# Patient Record
Sex: Male | Born: 1950
Health system: Southern US, Community
[De-identification: ages and names within clinical notes are randomized; demographics above are authoritative.]

## PROBLEM LIST (undated history)

## (undated) DIAGNOSIS — K5730 Diverticulosis of large intestine without perforation or abscess without bleeding: Secondary | ICD-10-CM

## (undated) DIAGNOSIS — Z86010 Personal history of colonic polyps: Secondary | ICD-10-CM

## (undated) DIAGNOSIS — Z860101 Personal history of adenomatous and serrated colon polyps: Secondary | ICD-10-CM

## (undated) DIAGNOSIS — I2510 Atherosclerotic heart disease of native coronary artery without angina pectoris: Secondary | ICD-10-CM

## (undated) DIAGNOSIS — E785 Hyperlipidemia, unspecified: Secondary | ICD-10-CM

## (undated) DIAGNOSIS — K219 Gastro-esophageal reflux disease without esophagitis: Secondary | ICD-10-CM

## (undated) DIAGNOSIS — D696 Thrombocytopenia, unspecified: Secondary | ICD-10-CM

## (undated) DIAGNOSIS — N400 Enlarged prostate without lower urinary tract symptoms: Secondary | ICD-10-CM

## (undated) DIAGNOSIS — K5790 Diverticulosis of intestine, part unspecified, without perforation or abscess without bleeding: Secondary | ICD-10-CM

## (undated) DIAGNOSIS — I440 Atrioventricular block, first degree: Secondary | ICD-10-CM

## (undated) DIAGNOSIS — Z8719 Personal history of other diseases of the digestive system: Secondary | ICD-10-CM

## (undated) DIAGNOSIS — G8929 Other chronic pain: Secondary | ICD-10-CM

## (undated) DIAGNOSIS — F419 Anxiety disorder, unspecified: Secondary | ICD-10-CM

## (undated) DIAGNOSIS — H409 Unspecified glaucoma: Secondary | ICD-10-CM

## (undated) DIAGNOSIS — H8110 Benign paroxysmal vertigo, unspecified ear: Secondary | ICD-10-CM

## (undated) DIAGNOSIS — K269 Duodenal ulcer, unspecified as acute or chronic, without hemorrhage or perforation: Secondary | ICD-10-CM

## (undated) DIAGNOSIS — R001 Bradycardia, unspecified: Secondary | ICD-10-CM

## (undated) DIAGNOSIS — E7800 Pure hypercholesterolemia, unspecified: Secondary | ICD-10-CM

## (undated) DIAGNOSIS — M1990 Unspecified osteoarthritis, unspecified site: Secondary | ICD-10-CM

## (undated) HISTORY — DX: Duodenal ulcer, unspecified as acute or chronic, without hemorrhage or perforation: K26.9

## (undated) HISTORY — DX: Benign paroxysmal vertigo, unspecified ear: H81.10

## (undated) HISTORY — DX: Atrioventricular block, first degree: I44.0

## (undated) HISTORY — DX: Thrombocytopenia, unspecified: D69.6

## (undated) HISTORY — DX: Bradycardia, unspecified: R00.1

## (undated) HISTORY — DX: Hyperlipidemia, unspecified: E78.5

## (undated) HISTORY — DX: Diverticulosis of large intestine without perforation or abscess without bleeding: K57.30

## (undated) HISTORY — PX: BACK SURGERY: SHX140

## (undated) HISTORY — DX: Personal history of other diseases of the digestive system: Z87.19

## (undated) HISTORY — DX: Atherosclerotic heart disease of native coronary artery without angina pectoris: I25.10

## (undated) HISTORY — DX: Pure hypercholesterolemia, unspecified: E78.00

## (undated) HISTORY — DX: Personal history of adenomatous and serrated colon polyps: Z86.0101

## (undated) HISTORY — DX: Other chronic pain: G89.29

## (undated) HISTORY — DX: Gastro-esophageal reflux disease without esophagitis: K21.9

## (undated) HISTORY — DX: Personal history of colonic polyps: Z86.010

## (undated) HISTORY — DX: Unspecified glaucoma: H40.9

## (undated) HISTORY — DX: Benign prostatic hyperplasia without lower urinary tract symptoms: N40.0

## (undated) HISTORY — PX: APPENDECTOMY: SHX54

## (undated) HISTORY — DX: Anxiety disorder, unspecified: F41.9

## (undated) HISTORY — DX: Diverticulosis of intestine, part unspecified, without perforation or abscess without bleeding: K57.90

## (undated) HISTORY — DX: Unspecified osteoarthritis, unspecified site: M19.90

---

## 1995-05-05 HISTORY — PX: OTHER SURGICAL HISTORY: SHX169

## 2001-09-08 ENCOUNTER — Other Ambulatory Visit: Admission: RE | Admit: 2001-09-08 | Discharge: 2001-09-08 | Payer: Self-pay | Admitting: *Deleted

## 2007-10-07 ENCOUNTER — Emergency Department (HOSPITAL_COMMUNITY): Admission: EM | Admit: 2007-10-07 | Discharge: 2007-10-07 | Payer: Self-pay | Admitting: Family Medicine

## 2008-05-04 HISTORY — PX: APPENDECTOMY: SHX54

## 2008-08-20 ENCOUNTER — Encounter (INDEPENDENT_AMBULATORY_CARE_PROVIDER_SITE_OTHER): Payer: Self-pay | Admitting: Surgery

## 2008-08-20 ENCOUNTER — Observation Stay (HOSPITAL_COMMUNITY): Admission: EM | Admit: 2008-08-20 | Discharge: 2008-08-21 | Payer: Self-pay | Admitting: Emergency Medicine

## 2009-07-21 ENCOUNTER — Emergency Department (HOSPITAL_COMMUNITY): Admission: EM | Admit: 2009-07-21 | Discharge: 2009-07-21 | Payer: Self-pay | Admitting: Emergency Medicine

## 2010-08-13 LAB — DIFFERENTIAL
Basophils Relative: 0 % (ref 0–1)
Eosinophils Absolute: 0 10*3/uL (ref 0.0–0.7)
Eosinophils Relative: 0 % (ref 0–5)
Lymphs Abs: 1 10*3/uL (ref 0.7–4.0)
Neutro Abs: 8.4 10*3/uL — ABNORMAL HIGH (ref 1.7–7.7)

## 2010-08-13 LAB — BASIC METABOLIC PANEL
BUN: 15 mg/dL (ref 6–23)
CO2: 24 mEq/L (ref 19–32)
Creatinine, Ser: 0.92 mg/dL (ref 0.4–1.5)
GFR calc Af Amer: >60 mL/min (ref >60)
Glucose, Bld: 135 mg/dL — ABNORMAL HIGH (ref 70–99)
Potassium: 4 mEq/L (ref 3.5–5.1)
Sodium: 137 mEq/L (ref 135–145)

## 2010-08-13 LAB — URINALYSIS, ROUTINE W REFLEX MICROSCOPIC
Bilirubin Urine: NEGATIVE
Leukocytes, UA: NEGATIVE
Nitrite: NEGATIVE
Protein, ur: 30 mg/dL — AB
Specific Gravity, Urine: 1.035 — ABNORMAL HIGH (ref 1.005–1.030)
Urobilinogen, UA: 0.2 mg/dL (ref 0.0–1.0)

## 2010-08-13 LAB — CBC
HCT: 42.2 % (ref 39.0–52.0)
MCHC: 34.2 g/dL (ref 30.0–36.0)
MCV: 90.4 fL (ref 78.0–100.0)
RDW: 13.1 % (ref 11.5–15.5)
WBC: 9.8 10*3/uL (ref 4.0–10.5)

## 2010-08-13 LAB — POCT CARDIAC MARKERS
CKMB, poc: 1.2 ng/mL (ref 1.0–8.0)
Myoglobin, poc: 58.7 ng/mL (ref 12–200)
Troponin i, poc: <0.05 ng/mL (ref 0.00–0.09)

## 2010-08-13 LAB — URINE MICROSCOPIC-ADD ON

## 2010-08-13 LAB — HEPATIC FUNCTION PANEL
AST: 24 U/L (ref 0–37)
Indirect Bilirubin: 0.4 mg/dL (ref 0.3–0.9)
Total Bilirubin: 0.6 mg/dL (ref 0.3–1.2)

## 2010-08-13 LAB — LIPASE, BLOOD: Lipase: 27 U/L (ref 11–59)

## 2010-09-16 NOTE — Op Note (Signed)
[name], Berger NO.:  [id number]   MEDICAL RECORD NO.:  [id number]          PATIENT TYPE:  INP   LOCATION:  1825                         FACILITY:  [facility]   PHYSICIAN:  Ardeth Sportsman, MD     DATE OF BIRTH:  [birth year]   DATE OF PROCEDURE:  08/20/2008  DATE OF DISCHARGE:                               OPERATIVE REPORT   PRIMARY CARE SERVICE:  Georgann Housekeeper, MD.   SURGEON:  Ardeth Sportsman, MD.   ASSISTANT:  None.   PREOPERATIVE DIAGNOSIS:  Acute appendicitis.   POSTOPERATIVE DIAGNOSIS:  Acute suppurative appendicitis.   PROCEDURE PERFORMED:  Laparoscopic appendectomy.   ANESTHESIA:  1. General anesthesia.  2. Local anesthetic and a field block around the port site.   SPECIMENS:  Appendix.   DRAINS:  None.   ESTIMATED BLOOD LOSS:  40 mL.   COMPLICATIONS:  None apparent.   INDICATIONS:  Dustin Berger is a pleasant 60 year old gentleman who had  abdominal pain for the past 24 hours.  It intensified and became more in  the lower abdomen.  He came to the emergency room, and workup by a CAT  scan and physical exam was concerning for acute appendicitis.   Physiology of the digestive tract was explained.  Pathophysiology of  appendicitis was discussed.  After discussion, recommendation was made  for laparoscopic appendectomy.  Possibly, conversion to open discussed.  Risks, benefits, and alternatives were discussed.  Questions were  answered and agreed to proceed.   OPERATIVE FINDINGS:  He had a retrocecal appendix with the tip of the  appendix coming towards the retroperitoneal midline.  There was some  suppuration, but no frank perforation or abscess.  The rest of his  abdomen was unremarkable for any concerns.   DESCRIPTION OF PROCEDURE:  Informed consent was confirmed.  The patient  received IV Unasyn prior to surgery.  He had sequential compression  devices active during the entire case.  He underwent general anesthesia  without any  difficulty.  He had a Foley catheter sterilely placed.  He  was positioned supine, both arms tucked.  His abdomen and perineum was  cleaned, prepped, and draped in a sterile fashion.   Using optical entry technique, a 5-mm port was placed in the left upper  quadrant, and the patient in deep reversed in Trendelenburg and left  side up.  Camera inspection revealed no intraabdominal injury.  Under  direct visualization, a 5-mm port was placed through the umbilicus and a  12-mm port was placed suprapubically aimed away from the bladder.   Camera inspection revealed some adhesions in the right lower quadrant.  The patient was positioned head down and left side down and small bowel  was reflected out of the right pelvis, and sharply releasing the cecum  in ascending colon in a lateral to medial fashion to help roll the cecum  off the pelvic side walls and off its retroperitoneal attachments.  The  attachments of the ileal mesentery to retroperitoneum were carefully  isolated and removed sharply as well.  In rolling this way, I finally  found a retrocecal appendix.  The base being cleaned, but the tip was  rather inflamed and explained some adhesions as well.  Window was made  at the base of the appendix and appendix was stapled off taking a  healthy cuff of cecum using laparoscopic stapler.   Careful cauterized dissection was done on the appendiceal mesentery  until it came down to a point where obviously the appendiceal artery  was.  This was ligated using a 0 PDS Endoloop and then cautery was used  to help free the appendix from its remaining attachments.  It was placed  inside EndoCatch bag and removed out from the suprapubic port.   Copious irrigation was done with good hemostasis at the end of the case.  There had been some moderate inflation and oozing, but all was quite at  the end.  Staple line was healthy and intact.  A copious irrigation was  done of the pelvis along the right  gutter as well as up in the liver,  was clear return at the end.  Capnoperitoneum was evacuated and ports  were removed.  The suprapubic fascia, the 12-mm port sight was closed  using a 0 Vicryl stitch and a UR 6 since it was superficial and easy to  get to.  The skin was closed using 4-0 Monocryl stitch.  Sterile  dressing was applied.   The patient was extubated and sent to recovery room in stable condition.  I am about to explain the operative findings to patient's family.      Ardeth Sportsman, MD  Electronically Signed     SCG/MEDQ  D:  08/20/2008  T:  08/21/2008  Job:  [id number]   cc:   Georgann Housekeeper, MD

## 2010-09-16 NOTE — Consult Note (Signed)
Dustin Berger, LAZAR            ACCOUNT NO.:  [id number]   MEDICAL RECORD NO.:  [id number]          PATIENT TYPE:  EMS   LOCATION:  MAJO                         FACILITY:  [facility]   PHYSICIAN:  Clovis Pu. Cornett, M.D.DATE OF BIRTH:  10/27/1950   DATE OF CONSULTATION:  08/20/2008  DATE OF DISCHARGE:                                 CONSULTATION   PHYSICIAN REQUESTING CONSULTATION:  Donnetta Hutching, MD   REASON FOR CONSULTATION:  Abdominal pain.   HISTORY OF PRESENT ILLNESS:  The patient is a 60 year old male with 1-  day history of abdominal pain.  The pain is located in his lower  abdomen.  It is 5-8/10.  There is no radiation.  They are sharp in  character, constant, and getting worse over the last 24 hours.  There is  no associated nausea, vomiting, fever, chills, or diarrhea.  The pain  does not radiate and is made worse with movement.  I was asked to see  the patient at the request of Dr. Adriana Simas in consultation.   PAST MEDICAL HISTORY:  Peptic ulcer disease.   PAST SURGICAL HISTORY:  Back surgery.   SOCIAL HISTORY:  Denies tobacco use, does drink alcohol occasionally.  He is married.   ALLERGIES:  None.   FAMILY HISTORY:  Noncontributory.   REVIEW OF SYSTEMS:  A 12-point review of systems as stated above.   PHYSICAL EXAMINATION:  VITAL SIGNS:  Temperature 97, pulse 53, and blood  pressure 106/61.  HEENT:  Extraocular movements are intact.  Oropharynx clear.  NECK:  Supple and nontender.  Trachea midline.  PULMONARY:  Lungs are clear bilaterally.  Chest wall motion is normal.  CARDIOVASCULAR:  Regular rate and rhythm with 2-3/6 systolic ejection  murmur noted.  ABDOMEN:  Tender in the lower midline just below the umbilicus.  There  is rebound.  No diffuse peritonitis.  No mass or hernia.  EXTREMITIES:  Joints appear normal with no deformity.  Muscle tone is  normal.  NEUROLOGIC:  Glasgow coma scale is 15.  Motor and sensory functions are  grossly intact.   DIAGNOSTIC  STUDIES:  Abdominal and pelvic CT scan shows early  appendicitis.  There is no evidence of abscess or free fluid.  His white  count 9800, hemoglobin 14, and platelet count is 156,000.  Sodium 137,  potassium 4, chloride 109, CO2 24, BUN 15, creatinine 0.9, glucose 135.  LFTs are normal.  Lipase is normal.  Chest x-ray shows no acute disease.  He does have some chronic right lower lobe bronchitic changes.   IMPRESSION:  Acute appendicitis.   PLAN:  Recommend laparoscopic appendectomy.  I have discussed the case  with Dr. Michaell Cowing, he was now called today.  Risk of bleeding, infection,  organ injury, need for open procedure, all potential complications.  He  will be admitted and placed on Unasyn 3 g IV q.6 and IV fluids and then  laparoscopic appendectomy will be performed at this point.      Thomas A. Cornett, M.D.  Electronically Signed     TAC/MEDQ  D:  08/20/2008  T:  08/20/2008  Job:  [id number]   cc:   Donnetta Hutching, MD

## 2011-08-13 ENCOUNTER — Other Ambulatory Visit: Payer: Self-pay | Admitting: Surgery

## 2011-12-03 ENCOUNTER — Other Ambulatory Visit: Payer: Self-pay | Admitting: Orthopedic Surgery

## 2011-12-10 NOTE — H&P (Signed)
Dustin Berger, Sr. Presents  for a consult regarding his recurrent large mucoid cyst on the dorsoradial aspect of his right thumb IP joint.  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  He has had a mucoid cyst in the dorsal aspect of his thumb for five months.  He was seen for evaluation at his primary care office and referred for dermatology consult. He had his  cyst debrided by Dr. Terri Piedra.  He has had a rather rapid recurrence.  He is now using superglue type material to try to protect the cyst.    He now presents for an upper extremity orthopaedic consult.    His past history reveals that he has no drug allergies.  He is on no routine medications.  Social history reveals that he is married, he is a nonsmoker.  He does not use alcoholic beverages.  Family history detailed and positive for diabetes.   14-point review of systems reveals corrective lenses, hearing impairment, history of peptic ulcer disease.   Physical examination reveals a well appearing 61 year-old gentleman . Inspection of his hands reveals stigmata of osteoarthritis including Heberden's and Bouchard's nodes.  He has a very substantial mucoid cyst in the dorsoradial aspect of his nail fold. There is some nail pressure change in the nail plate.  He appears to have an impending rupture.  He has a large cyst tracking proximal to the IP joint. He has full range of motion of his CMC and MP joints.  We did not test his  IP range of motion due to concern about cyst rupture.    Plain films of his thumb, four views, were obtained.  He has multiple loose bodies within the interphalangeal joint as well as large marginal osteophytes.   ASSESSMENT:  Significant osteoarthritis of right thumb IP joint with mucoid cyst formation, loose bodies and marginal osteophytes.   PLAN:  I explained to him the art of debriding the IP joint in an effort to resolve his predicament.    I would recommend repeat cyst excision, I&D of the right thumb IP  joint, removal of marginal osteophytes and the loose bodies.  There is a palmar loose body that is probably not accessible without major surgery.   There is a small chance that he will have recurrence of his cyst.  Typically with good joint debridement we can prevent recurrence of the cyst.     H&P documentation: 12/11/2011  -History and Physical Reviewed  -Patient has been re-examined  -No change in the plan of care  Wyn Forster, MD

## 2011-12-11 ENCOUNTER — Encounter (HOSPITAL_BASED_OUTPATIENT_CLINIC_OR_DEPARTMENT_OTHER)
Admission: RE | Disposition: A | Discharge status: Home / Self Care | Payer: Self-pay | Source: Ambulatory Visit | Attending: Orthopedic Surgery

## 2011-12-11 ENCOUNTER — Ambulatory Visit (HOSPITAL_BASED_OUTPATIENT_CLINIC_OR_DEPARTMENT_OTHER)
Admission: RE | Admit: 2011-12-11 | Discharge: 2011-12-11 | Disposition: A | Discharge status: Home / Self Care | Payer: BC Managed Care – PPO | Source: Ambulatory Visit | Attending: Orthopedic Surgery | Admitting: Orthopedic Surgery

## 2011-12-11 ENCOUNTER — Encounter (HOSPITAL_BASED_OUTPATIENT_CLINIC_OR_DEPARTMENT_OTHER): Payer: Self-pay | Admitting: *Deleted

## 2011-12-11 DIAGNOSIS — M6740 Ganglion, unspecified site: Secondary | ICD-10-CM | POA: Insufficient documentation

## 2011-12-11 DIAGNOSIS — M19049 Primary osteoarthritis, unspecified hand: Secondary | ICD-10-CM | POA: Insufficient documentation

## 2011-12-11 DIAGNOSIS — M24049 Loose body in unspecified finger joint(s): Secondary | ICD-10-CM | POA: Insufficient documentation

## 2011-12-11 HISTORY — PX: MASS EXCISION: SHX2000

## 2011-12-11 SURGERY — MINOR EXCISION OF MASS
Anesthesia: LOCAL | Site: Thumb | Laterality: Right | Wound class: Clean

## 2011-12-11 MED ORDER — CEPHALEXIN 500 MG PO CAPS: 500.0000 mg | ORAL_CAPSULE | Freq: Three times a day (TID) | ORAL | Status: AC

## 2011-12-11 MED ORDER — CHLORHEXIDINE GLUCONATE 4 % EX LIQD: 60.0000 mL | Freq: Once | CUTANEOUS | Status: DC

## 2011-12-11 MED ORDER — LIDOCAINE HCL 2 % IJ SOLN
INTRAMUSCULAR | Status: DC | PRN
  Administered 2011-12-11: 4 mL

## 2011-12-11 MED ORDER — HYDROCODONE-ACETAMINOPHEN 5-325 MG PO TABS: ORAL_TABLET | ORAL | Status: AC

## 2011-12-11 SURGICAL SUPPLY — 40 items
BANDAGE ADHESIVE 1X3 (GAUZE/BANDAGES/DRESSINGS) IMPLANT
BLADE SURG 15 STRL LF DISP TIS (BLADE) ×1 IMPLANT
BLADE SURG 15 STRL SS (BLADE) ×2
BNDG CMPR 9X4 STRL LF SNTH (GAUZE/BANDAGES/DRESSINGS) ×1
BNDG CMPR MD 5X2 ELC HKLP STRL (GAUZE/BANDAGES/DRESSINGS)
BNDG COHESIVE 1X5 TAN STRL LF (GAUZE/BANDAGES/DRESSINGS) ×1 IMPLANT
BNDG ELASTIC 2 VLCR STRL LF (GAUZE/BANDAGES/DRESSINGS) IMPLANT
BNDG ESMARK 4X9 LF (GAUZE/BANDAGES/DRESSINGS) ×1 IMPLANT
BRUSH SCRUB EZ PLAIN DRY (MISCELLANEOUS) ×2 IMPLANT
CLOTH BEACON ORANGE TIMEOUT ST (SAFETY) ×2 IMPLANT
CORDS BIPOLAR (ELECTRODE) IMPLANT
COVER MAYO STAND STRL (DRAPES) ×3 IMPLANT
CUFF TOURNIQUET SINGLE 18IN (TOURNIQUET CUFF) ×1 IMPLANT
DECANTER SPIKE VIAL GLASS SM (MISCELLANEOUS) IMPLANT
DRAIN PENROSE 1/2X12 LTX STRL (WOUND CARE) IMPLANT
DRAPE EXTREMITY T 121X128X90 (DRAPE) ×1 IMPLANT
DRAPE SURG 17X23 STRL (DRAPES) ×2 IMPLANT
GAUZE SPONGE 4X4 12PLY STRL LF (GAUZE/BANDAGES/DRESSINGS) ×4 IMPLANT
GAUZE XEROFORM 1X8 LF (GAUZE/BANDAGES/DRESSINGS) ×1 IMPLANT
GLOVE BIO SURGEON STRL SZ7 (GLOVE) ×1 IMPLANT
GLOVE BIOGEL M STRL SZ7.5 (GLOVE) ×2 IMPLANT
GLOVE BIOGEL PI IND STRL 7.0 (GLOVE) IMPLANT
GLOVE BIOGEL PI INDICATOR 7.0 (GLOVE) ×2
GLOVE ORTHO TXT STRL SZ7.5 (GLOVE) ×2 IMPLANT
GOWN PREVENTION PLUS XLARGE (GOWN DISPOSABLE) ×2 IMPLANT
NEEDLE 27GAX1X1/2 (NEEDLE) ×1 IMPLANT
PACK BASIN DAY SURGERY FS (CUSTOM PROCEDURE TRAY) ×2 IMPLANT
PADDING CAST ABS 4INX4YD NS (CAST SUPPLIES) ×1
PADDING CAST ABS COTTON 4X4 ST (CAST SUPPLIES) ×1 IMPLANT
SLING ARM FOAM STRAP LRG (SOFTGOODS) ×1 IMPLANT
SPONGE GAUZE 4X4 12PLY (GAUZE/BANDAGES/DRESSINGS) ×2 IMPLANT
STOCKINETTE 4X48 STRL (DRAPES) ×2 IMPLANT
SUT ETHILON 5 0 P 3 18 (SUTURE) ×1
SUT NYLON ETHILON 5-0 P-3 1X18 (SUTURE) ×1 IMPLANT
SYR 3ML 23GX1 SAFETY (SYRINGE) IMPLANT
SYR CONTROL 10ML LL (SYRINGE) ×1 IMPLANT
TOWEL OR 17X24 6PK STRL BLUE (TOWEL DISPOSABLE) ×3 IMPLANT
TRAY DSU PREP LF (CUSTOM PROCEDURE TRAY) ×2 IMPLANT
UNDERPAD 30X30 INCONTINENT (UNDERPADS AND DIAPERS) ×2 IMPLANT
WATER STERILE IRR 1000ML POUR (IV SOLUTION) ×1 IMPLANT

## 2011-12-11 NOTE — Brief Op Note (Signed)
12/11/2011  11:34 AM  PATIENT:  Dustin Berger  61 y.o. male  PRE-OPERATIVE DIAGNOSIS:  MUCOID CYST RIGHT THUMB with large IP joint loose body  POST-OPERATIVE DIAGNOSIS:  Mucoid cyst right thumb with large loose body and synovitis  PROCEDURE:  Interphalangeal joint debridement with removal of large loose body from radial base of distal phalanx  SURGEON:  Surgeon(s) and Role:    * Wyn Forster., MD - Primary  PHYSICIAN ASSISTANT:   ASSISTANTS: Mallory Shirk.A-C   ANESTHESIA:   local  EBL:     BLOOD ADMINISTERED:none  DRAINS: none   LOCAL MEDICATIONS USED:  XYLOCAINE   SPECIMEN:  Excision  DISPOSITION OF SPECIMEN:  N/A  COUNTS:  YES  TOURNIQUET:   Total Tourniquet Time Documented: Forearm (Right) - 18 minutes  DICTATION: .Other Dictation: Dictation Number [id number]  PLAN OF CARE: Discharge to home after PACU  PATIENT DISPOSITION:  PACU - hemodynamically stable.

## 2011-12-11 NOTE — Op Note (Signed)
[id number] 

## 2011-12-11 NOTE — OR Nursing (Signed)
No specimen sent to Pathology per request of Dr. Teressa Senter.

## 2011-12-14 ENCOUNTER — Encounter (HOSPITAL_BASED_OUTPATIENT_CLINIC_OR_DEPARTMENT_OTHER): Payer: Self-pay | Admitting: Orthopedic Surgery

## 2011-12-14 NOTE — Op Note (Signed)
NAMEDONIVIN, WIRT            ACCOUNT NO.:  [id number]  MEDICAL RECORD NO.:  [id number]  LOCATION:                                 FACILITY:  PHYSICIAN:  Katy Fitch. Farran Amsden, M.D.      DATE OF BIRTH:  DATE OF PROCEDURE:  12/11/2011 DATE OF DISCHARGE:                              OPERATIVE REPORT   PREOPERATIVE DIAGNOSES:  Right thumb large mucoid cyst, recurrent, following prior debridement by dermatologist with large loose body within dorsoradial aspect of right thumb interphalangeal joint with background significant degenerative arthritis of interphalangeal joint.  POSTOPERATIVE DIAGNOSES:  Right thumb large mucoid cyst, recurrent, following prior debridement by dermatologist with large loose body within dorsoradial aspect of right thumb interphalangeal joint with background significant degenerative arthritis of interphalangeal joint.  OPERATIONS: 1. Arthrotomy of right thumb interphalangeal joint with removal of     large dorsal radial loose body with debridement of marginal     osteophytes, synovectomy, and irrigation of interphalangeal joint. 2. Resection of persistent/recurrent mucoid cyst on dorsal radial     aspect of right thumb nail fold.  OPERATING SURGEON:  Katy Fitch. Landrie Beale, MD.  ASSISTANT:  Marveen Reeks. Dasnoit, PA-C.  ANESTHESIA:  Lidocaine 2% metacarpal head-level block of right thumb. This was performed as a minor operating procedure.  ANESTHETIST:  Octaviano Glow. Pamalee Leyden, M.D.  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  He has had a history of osteoarthritis of the thumb interphalangeal joints for many years.  He has large osteophytes at the base of his distal phalanges bilaterally.  On the right, he developed a large mucoid cyst.  This was debrided by dermatologist.  Unfortunately, he had recurrence.  He was then sent for an upper extremity orthopedic consult.  His past history is reviewed in detail.  He does  have background osteoarthritis affecting multiple joints.  X-rays of his thumb documented a large loose body in the dorsoradial aspect of his thumb IP joint and a small loose body in the palmar aspect of the joint.  We advised Mr. Doiron that there was a good chance we could relieve his mucoid cyst predicament by debridement of his IP joint with removal of loose body.  We will not try to remove the palmar loose body as it is in a very inaccessible position.  Detailed informed consent regarding the anticipated surgery is provided in the office and in the Holding Area of the [facility]. Questions were invited and answered in detail.  PROCEDURE:  Blayde Bacigalupi was brought to room 1 of the [facility] and placed in supine position on the operating table.  Following informed consent and alcohol Betadine prep, 4 mL of 2% lidocaine without epinephrine were infiltrated at metacarpal head level circumferentially to obtain a digital block.  After 10 minutes, excellent anesthesia was achieved.  The right hand and arm were then prepped with Betadine soap and solution, sterilely draped.  A pneumatic tourniquet was applied to the proximal forearm.  A digital tourniquet was not feasible due to the location of the cyst and loose body.  After routine surgical time-out, the thumb was exsanguinated as well as the arm with  an Esmarch bandage, followed by inflation of the arterial tourniquet on the proximal forearm to 220 mmHg.  Procedure commenced with a longitudinal incision incorporating the cyst and the large osteophyte/loose body.  Subcutaneous tissues were carefully divided taking care to identify and spare the dorsal radial sensory branch to the dorsal radial aspect of the thumb.  Arthrotomy was created by excision of the capsule between the terminal extensor tendon slip and the radial collateral ligament.  A very large loose body measuring about 8 x 4 x 5 mm was  carefully removed with a 2-mm osteotome and a fine Freer.  This was shown to Mr. Harshman in the operating room.  We then performed a meticulous synovectomy and debridement of the IP joint.  There were large marginal osteophytes and multiple cartilaginous loose bodies.  These were debrided thoroughly. The IP joint was then irrigated under pressure with a 19-gauge dental needle.  The dorsal nail fold was then carefully dissected.  Care was taken to avoid the dorsal and intermediate nail fold.  The cyst appeared to be tracking to the dorsoradial and central aspect of the IP joint.  A micro- curette was used directly on the periosteum of the distal phalanx to remove the membrane of the cyst.  The contents of this were expressed. The wound was then closed with a small rotation flap utilizing 5-0 nylon sutures.  There were no apparent complications.  The tourniquet was released with immediate cap refill of the fingers.  There was some vasospasm in the thumb, but excellent cap refill and normal turgor was achieved.  The wound was dressed with Xeroflo sterile gauze and a wrist-base Coban thumb dressing.  There were no apparent complications.  Mr. Litzau is provided a prescription for hydrocodone 5/325, 1 p.o. q.4-6 hours p.r.n. pain, 20 tablets, without refill; also Keflex 5 mg 1 p.o. q.8 hours for 4 days as a prophylactic antibiotic.     Katy Fitch Isra Lindy, M.D.     RVS/MEDQ  D:  12/11/2011  T:  12/12/2011  Job:  [id number]

## 2012-07-30 ENCOUNTER — Emergency Department (HOSPITAL_COMMUNITY): Payer: BC Managed Care – PPO

## 2012-07-30 ENCOUNTER — Emergency Department (HOSPITAL_COMMUNITY)
Admission: EM | Admit: 2012-07-30 | Discharge: 2012-07-30 | Disposition: A | Discharge status: Home / Self Care | Payer: BC Managed Care – PPO | Attending: Emergency Medicine | Admitting: Emergency Medicine

## 2012-07-30 ENCOUNTER — Encounter (HOSPITAL_COMMUNITY): Payer: Self-pay | Admitting: Physical Medicine and Rehabilitation

## 2012-07-30 DIAGNOSIS — R079 Chest pain, unspecified: Secondary | ICD-10-CM | POA: Insufficient documentation

## 2012-07-30 DIAGNOSIS — R42 Dizziness and giddiness: Secondary | ICD-10-CM | POA: Insufficient documentation

## 2012-07-30 DIAGNOSIS — R011 Cardiac murmur, unspecified: Secondary | ICD-10-CM | POA: Insufficient documentation

## 2012-07-30 LAB — BASIC METABOLIC PANEL
BUN: 18 mg/dL (ref 6–23)
GFR calc Af Amer: 73 mL/min — ABNORMAL LOW (ref >90)
GFR calc non Af Amer: 63 mL/min — ABNORMAL LOW (ref >90)
Potassium: 3.8 mEq/L (ref 3.5–5.1)
Sodium: 140 mEq/L (ref 135–145)

## 2012-07-30 LAB — POCT I-STAT TROPONIN I

## 2012-07-30 LAB — GLUCOSE, CAPILLARY: Glucose-Capillary: 106 mg/dL — ABNORMAL HIGH (ref 70–99)

## 2012-07-30 LAB — CBC
MCHC: 34.2 g/dL (ref 30.0–36.0)
RDW: 13 % (ref 11.5–15.5)

## 2012-07-30 MED ORDER — ASPIRIN 325 MG PO TABS
325.0000 mg | ORAL_TABLET | Freq: Once | ORAL | Status: AC
  Administered 2012-07-30: 325 mg via ORAL
  Filled 2012-07-30: qty 1

## 2012-07-30 MED ORDER — SODIUM CHLORIDE 0.9 % IV BOLUS (SEPSIS)
1000.0000 mL | Freq: Once | INTRAVENOUS | Status: AC
  Administered 2012-07-30: 1000 mL via INTRAVENOUS

## 2012-07-30 NOTE — ED Notes (Signed)
CBG 106 

## 2012-07-30 NOTE — ED Notes (Signed)
Pt presents to department for evaluation of dizziness/lightheadedness and L sided chest pain. Onset this morning @ 7:30am. 2/10 L sided chest pressure at the time. Also states "I feel like I'm going to pass out sometimes." He is conscious alert and oriented x4. Respirations unlabored.

## 2012-07-30 NOTE — ED Provider Notes (Signed)
History     CSN: [id number]  Arrival date & time 07/30/12  [id number]   First MD Initiated Contact with Patient 07/30/12 [phone]      No chief complaint on file.   (Consider location/radiation/quality/duration/timing/severity/associated sxs/prior treatment) HPI Comments: 62 year old male with no significant past medical history presents to the emergency department complaining of an episode of lightheadedness and dizziness while driving to work about 2 hours prior to arrival. Patient states he got into his car after eating a biscuit for breakfast and began to feel lightheaded and dizzy. He explains the dizzy feeling as if he is spinning and "not feeling right". He got to work and sat down, still did not feel well and decided to come to the emergency department. Upon arrival to the emergency department he developed a "twinge" on the left side of his chest which did not last very long and has not returned. The "twinge" subsided on its own without any intervention. He has not had any symptoms like this in the past. Denies associated nausea, vomiting, diaphoresis, headache, fever, chills or confusion. Yesterday and earlier this morning patient was feeling perfectly fine. He is a nonsmoker. No family history of heart disease. Family history is positive for diabetes.  The history is provided by the patient.    No past medical history on file.  Past Surgical History  Procedure Laterality Date  . Mass excision  12/11/2011    Procedure: MINOR EXCISION OF MASS;  Surgeon: Wyn Forster., MD;  Location: [facility];  Service: Orthopedics;  Laterality: Right;   Excision Mucoid Cyst Right Thumb; Interphalangeal Joint Debridement    No family history on file.  History  Substance Use Topics  . Smoking status: Not on file  . Smokeless tobacco: Not on file  . Alcohol Use: Not on file      Review of Systems  Constitutional: Negative for fever, chills, diaphoresis and appetite change.   Eyes: Negative for visual disturbance.  Respiratory: Negative for cough and shortness of breath.   Cardiovascular: Positive for chest pain.  Gastrointestinal: Negative for nausea and vomiting.  Musculoskeletal: Negative for back pain.  Skin: Negative for pallor.  Neurological: Positive for dizziness and light-headedness. Negative for syncope, weakness and headaches.  Psychiatric/Behavioral: Negative for confusion.  All other systems reviewed and are negative.    Allergies  Review of patient's allergies indicates not on file.  Home Medications  No current outpatient prescriptions on file.  BP 132/66  Pulse 69  Temp(Src) 97.7 F (36.5 C) (Oral)  Resp 18  SpO2 99%  Physical Exam  Nursing note and vitals reviewed. Constitutional: He is oriented to person, place, and time. He appears well-developed and well-nourished. No distress.  HENT:  Head: Normocephalic and atraumatic.  Mouth/Throat: Oropharynx is clear and moist.  Eyes: Conjunctivae and EOM are normal. Pupils are equal, round, and reactive to light.  Neck: Normal range of motion. Neck supple. No tracheal deviation present.  Cardiovascular: Normal rate, regular rhythm and intact distal pulses.   Murmur heard.  Systolic murmur is present with a grade of 2/6  No extremity edema.  Pulmonary/Chest: Effort normal and breath sounds normal. No respiratory distress. He has no wheezes. He has no rales. He exhibits no tenderness.  Abdominal: Soft. Bowel sounds are normal. He exhibits no distension and no mass. There is no tenderness.  Musculoskeletal: Normal range of motion. He exhibits no edema.  Neurological: He is alert and oriented to person, place, and time. He has  normal strength. No cranial nerve deficit or sensory deficit. He displays a negative Romberg sign. Coordination and gait normal.  Skin: Skin is warm and dry. He is not diaphoretic. No pallor.  Psychiatric: He has a normal mood and affect. His behavior is normal.     ED Course  Procedures (including critical care time)  Labs Reviewed  CBC - Abnormal; Notable for the following:    Platelets 121 (*)    All other components within normal limits  BASIC METABOLIC PANEL - Abnormal; Notable for the following:    Glucose, Bld 125 (*)    GFR calc non Af Amer 63 (*)    GFR calc Af Amer 73 (*)    All other components within normal limits  GLUCOSE, CAPILLARY - Abnormal; Notable for the following:    Glucose-Capillary 106 (*)    All other components within normal limits  POCT I-STAT TROPONIN I    Date: 07/30/2012  Rate: 61  Rhythm: normal sinus rhythm  QRS Axis: normal  Intervals: normal  ST/T Wave abnormalities: normal  Conduction Disutrbances:borderline AV conduction delay  Narrative Interpretation: NSR, borderline AV conduction delay, early precordial R/S transition, baseline wander in V2, no sig change since last EKG tracing April 2010, no stemi.  Old EKG Reviewed: unchanged   Dg Chest 2 View  07/30/2012  *RADIOLOGY REPORT*  Clinical Data: Lightheadedness.  Fatigue.  Weakness.  CHEST - 2 VIEW  Comparison: 08/20/2008  Findings: Mild cardiomegaly noted with suspected mild chronic lingular scarring laterally. No edema.  The lungs appear otherwise clear.  No pleural effusion.  Thoracic spondylosis noted.  IMPRESSION:  1.  Mild cardiomegaly, without edema. 2.  Thoracic spondylosis. 3.  Pleural thickening likely mild peripheral scarring along the lingula.  This is a chronic appearance, unchanged from 2010.   Original Report Authenticated By: Gaylyn Rong, M.D.      1. Lightheadedness   2. Dizziness       MDM  62 y/o healthy male presenting with non-specific episode of lightheadedness/dizziness. Symptoms do not correlate with vertigo. PE unremarkable other than faint systolic blowing murmur. VSS. Short episode of left sided chest "twinge". Obtaining CBC, bmet, istat troponin, cxr, ekg. Patient is a low risk patient for cardiac disease. If  workup is normal, will d/c home with cardiology follow up outpatient for echocardiogram. Case discussed with Dr. Hyacinth Meeker who also evaluated patient and agrees with plan of care.  9:39 AM Labs WNL. Troponin negative. EKG and CXR without any acute findings. He is currently asymptomatic in the ED. Chest "twinge" has not returned. He is stable for d/c with outpatient cardiology f/u. Return precautions discussed. Patient states understanding of plan and is agreeable.     Trevor Mace, PA-C 07/30/12 [phone]

## 2012-07-30 NOTE — ED Provider Notes (Signed)
62 year old male, works for the [employer] presents after becoming significantly lightheaded and having near-syncope on his way to work this morning. The patient states that he awoke feeling his normal self, as it will have breakfast but felt slightly fatigued, his symptoms gradually worsened. He denies any focal numbness weakness or difficulty speaking. He states that he had some difficulty with his vision when he became near syncopal but states that it is all resolved at this time he feels normal. He has no headache, no palpitations, no shortness of breath cough fever nausea vomiting diarrhea swelling or rashes. He did admit to having a slight amount of chest pain in his chest on his arrival but even this has resolved as well. The patient does not take any prescription medications and has never been diagnosed with hypertension, diabetes, hypercholesterolemia and is not a smoker. He drinks alcohol occasionally but has not had any in a couple of weeks.  On exam the patient has a soft systolic murmur, otherwise his exam is totally normal without any focal neurologic findings, soft abdomen, clear heart and lung sounds other than a soft murmur and no peripheral edema. We'll pursue laboratory workup as well as an EKG and a chest x-ray, patient asymptomatic at this time.   Medical screening examination/treatment/procedure(s) were conducted as a shared visit with non-physician practitioner(s) and myself.  I personally evaluated the patient during the encounter    Vida Roller, MD 07/31/12 [phone]

## 2012-07-31 NOTE — ED Provider Notes (Signed)
Medical screening examination/treatment/procedure(s) were conducted as a shared visit with non-physician practitioner(s) and myself.  I personally evaluated the patient during the encounter  Please see my separate respective documentation pertaining to this patient encounter  I personally dw pt need for f/u for murmur and further light headed w/u if sx should continue - asx at d/c with no c/o.  Vida Roller, MD 07/31/12 [phone]

## 2013-03-24 ENCOUNTER — Emergency Department (INDEPENDENT_AMBULATORY_CARE_PROVIDER_SITE_OTHER): Payer: BC Managed Care – PPO

## 2013-03-24 ENCOUNTER — Emergency Department (HOSPITAL_COMMUNITY)
Admission: EM | Admit: 2013-03-24 | Discharge: 2013-03-24 | Disposition: A | Discharge status: Home / Self Care | Payer: BC Managed Care – PPO | Source: Home / Self Care | Attending: Family Medicine | Admitting: Family Medicine

## 2013-03-24 ENCOUNTER — Encounter (HOSPITAL_COMMUNITY): Payer: Self-pay | Admitting: Emergency Medicine

## 2013-03-24 DIAGNOSIS — M545 Low back pain: Secondary | ICD-10-CM

## 2013-03-24 MED ORDER — MELOXICAM 7.5 MG PO TABS: 7.5000 mg | ORAL_TABLET | Freq: Every day | ORAL | Status: DC

## 2013-03-24 MED ORDER — HYDROCODONE-ACETAMINOPHEN 5-325 MG PO TABS: 2.0000 | ORAL_TABLET | ORAL | Status: DC | PRN

## 2013-03-24 MED ORDER — METHOCARBAMOL 500 MG PO TABS: 500.0000 mg | ORAL_TABLET | Freq: Two times a day (BID) | ORAL | Status: DC

## 2013-03-24 NOTE — ED Provider Notes (Signed)
CSN: [id number]     Arrival date & time 03/24/13  2725 History   First MD Initiated Contact with Patient 03/24/13 [phone]     Chief Complaint  Patient presents with  . Back Pain   (Consider location/radiation/quality/duration/timing/severity/associated sxs/prior Treatment) Patient is a 62 y.o. male presenting with back pain. The history is provided by the patient. No language interpreter was used.  Back Pain Location:  Generalized Quality:  Aching Radiates to:  Does not radiate Pain severity:  Moderate Pain is:  Worse during the day Onset quality:  Gradual Duration:  2 weeks Timing:  Constant Progression:  Worsening Chronicity:  New Relieved by:  Nothing Worsened by:  Nothing tried Ineffective treatments:  None tried Pt complains of pain to his low back.   Pt reports he is unable to sleep due to pain.  Pt delivers the mail  History reviewed. No pertinent past medical history. Past Surgical History  Procedure Laterality Date  . Mass excision  12/11/2011    Procedure: MINOR EXCISION OF MASS;  Surgeon: Wyn Forster., MD;  Location: [facility];  Service: Orthopedics;  Laterality: Right;   Excision Mucoid Cyst Right Thumb; Interphalangeal Joint Debridement  . Back surgery    . Appendectomy     No family history on file. History  Substance Use Topics  . Smoking status: Never Smoker   . Smokeless tobacco: Not on file  . Alcohol Use: No    Review of Systems  Musculoskeletal: Positive for back pain.  All other systems reviewed and are negative.    Allergies  Review of patient's allergies indicates no known allergies.  Home Medications   Current Outpatient Rx  Name  Route  Sig  Dispense  Refill  . acetaminophen (TYLENOL) 325 MG tablet   Oral   Take 650 mg by mouth every 6 (six) hours as needed for pain.         Marland Kitchen ibuprofen (ADVIL,MOTRIN) 200 MG tablet   Oral   Take 200 mg by mouth every 6 (six) hours as needed for pain.          BP 131/85   Pulse 55  Temp(Src) 97.5 F (36.4 C) (Oral)  Resp 16  SpO2 96% Physical Exam  Nursing note and vitals reviewed. Constitutional: He appears well-developed and well-nourished.  HENT:  Head: Normocephalic and atraumatic.  Eyes: Pupils are equal, round, and reactive to light.  Neck: Normal range of motion.  Cardiovascular: Normal rate and regular rhythm.   Pulmonary/Chest: Effort normal.  Abdominal: Soft.  Musculoskeletal:  Tender ls spine decreased range of motion   Neurological: He is alert. He has normal reflexes.  Skin: Skin is warm.  Psychiatric: He has a normal mood and affect.    ED Course  Procedures (including critical care time) Labs Review Labs Reviewed - No data to display Imaging Review No results found.  EKG Interpretation    Date/Time:    Ventricular Rate:    PR Interval:    QRS Duration:   QT Interval:    QTC Calculation:   R Axis:     Text Interpretation:              MDM  No diagnosis found.   Hydrocodone. Ibuprofen and robaxin  Schedule to see Dr. Roda Shutters for evaluation   Elson Areas, PA-C 03/24/13 1110  Lonia Skinner [place], [place] 03/24/13 1110

## 2013-03-24 NOTE — ED Notes (Signed)
C/o back pain.  Low back pain, left greater than right, pain at night worsens, noticed in hips when lying down.  Denies leg pain.  No specific injury.  Repetitious  Movement at work.  Will need a work note

## 2013-03-24 NOTE — ED Notes (Signed)
Patient requested work note, discussed with karen, pa

## 2013-03-24 NOTE — ED Provider Notes (Signed)
Medical screening examination/treatment/procedure(s) were performed by resident physician or non-physician practitioner and as supervising physician I was immediately available for consultation/collaboration.   Barkley Bruns MD.   Linna Hoff, MD 03/24/13 [phone]

## 2014-04-11 ENCOUNTER — Ambulatory Visit
Admission: RE | Admit: 2014-04-11 | Discharge: 2014-04-11 | Disposition: A | Discharge status: Home / Self Care | Payer: BC Managed Care – PPO | Source: Ambulatory Visit | Attending: Internal Medicine | Admitting: Internal Medicine

## 2014-04-11 ENCOUNTER — Other Ambulatory Visit: Payer: Self-pay | Admitting: Internal Medicine

## 2014-04-11 DIAGNOSIS — M542 Cervicalgia: Secondary | ICD-10-CM

## 2014-04-11 DIAGNOSIS — M2550 Pain in unspecified joint: Secondary | ICD-10-CM

## 2014-05-08 ENCOUNTER — Ambulatory Visit: Payer: BC Managed Care – PPO | Admitting: Cardiovascular Disease

## 2015-06-12 ENCOUNTER — Other Ambulatory Visit (HOSPITAL_COMMUNITY): Payer: Self-pay | Admitting: Internal Medicine

## 2015-06-12 ENCOUNTER — Telehealth (HOSPITAL_COMMUNITY): Payer: Self-pay | Admitting: *Deleted

## 2015-06-12 DIAGNOSIS — R079 Chest pain, unspecified: Secondary | ICD-10-CM

## 2015-06-12 NOTE — Telephone Encounter (Signed)
Left message on voicemail in reference to upcoming appointment scheduled for 06/18/15. Phone number given for a call back so details instructions can be given. Hubbard Robinson, RN

## 2015-06-13 ENCOUNTER — Telehealth (HOSPITAL_COMMUNITY): Payer: Self-pay | Admitting: *Deleted

## 2015-06-13 NOTE — Telephone Encounter (Signed)
Patient given detailed instructions per Myocardial Perfusion Study Information Sheet for the test on 06/18/15 at 715. Patient notified to arrive 15 minutes early and that it is imperative to arrive on time for appointment to keep from having the test rescheduled.  If you need to cancel or reschedule your appointment, please call the office within 24 hours of your appointment. Failure to do so may result in a cancellation of your appointment, and a $50 no show fee. Patient verbalized understanding.Hubbard Robinson, RN

## 2015-06-18 ENCOUNTER — Ambulatory Visit (HOSPITAL_COMMUNITY): Payer: BLUE CROSS/BLUE SHIELD | Attending: Cardiology

## 2015-06-18 DIAGNOSIS — R0602 Shortness of breath: Secondary | ICD-10-CM | POA: Insufficient documentation

## 2015-06-18 DIAGNOSIS — R42 Dizziness and giddiness: Secondary | ICD-10-CM | POA: Diagnosis not present

## 2015-06-18 DIAGNOSIS — R079 Chest pain, unspecified: Secondary | ICD-10-CM

## 2015-06-18 LAB — MYOCARDIAL PERFUSION IMAGING
CHL CUP NUCLEAR SDS: 0
CHL CUP NUCLEAR SRS: 2
CHL CUP RESTING HR STRESS: 45 {beats}/min
CSEPED: 8 min
CSEPEDS: 0 s
Estimated workload: 10.1 METS
LV dias vol: 104 mL
LV sys vol: 48 mL
MPHR: 156 {beats}/min
NUC STRESS TID: 1.01
Peak HR: 144 {beats}/min
Percent HR: 93 %
RATE: 0.36
RPE: 18
SSS: 2

## 2015-06-18 MED ORDER — TECHNETIUM TC 99M SESTAMIBI GENERIC - CARDIOLITE
33.0000 | Freq: Once | INTRAVENOUS | Status: AC | PRN
  Administered 2015-06-18: 33 via INTRAVENOUS

## 2015-06-18 MED ORDER — TECHNETIUM TC 99M SESTAMIBI GENERIC - CARDIOLITE
10.8000 | Freq: Once | INTRAVENOUS | Status: AC | PRN
  Administered 2015-06-18: 11 via INTRAVENOUS

## 2016-08-12 DIAGNOSIS — M1990 Unspecified osteoarthritis, unspecified site: Secondary | ICD-10-CM | POA: Diagnosis not present

## 2016-08-12 DIAGNOSIS — E7800 Pure hypercholesterolemia, unspecified: Secondary | ICD-10-CM | POA: Diagnosis not present

## 2016-08-12 DIAGNOSIS — Z1389 Encounter for screening for other disorder: Secondary | ICD-10-CM | POA: Diagnosis not present

## 2016-08-12 DIAGNOSIS — D696 Thrombocytopenia, unspecified: Secondary | ICD-10-CM | POA: Diagnosis not present

## 2016-08-12 DIAGNOSIS — Z0000 Encounter for general adult medical examination without abnormal findings: Secondary | ICD-10-CM | POA: Diagnosis not present

## 2016-08-12 DIAGNOSIS — Z1159 Encounter for screening for other viral diseases: Secondary | ICD-10-CM | POA: Diagnosis not present

## 2016-08-12 DIAGNOSIS — K219 Gastro-esophageal reflux disease without esophagitis: Secondary | ICD-10-CM | POA: Diagnosis not present

## 2016-08-12 DIAGNOSIS — Z125 Encounter for screening for malignant neoplasm of prostate: Secondary | ICD-10-CM | POA: Diagnosis not present

## 2016-08-12 DIAGNOSIS — Z23 Encounter for immunization: Secondary | ICD-10-CM | POA: Diagnosis not present

## 2016-10-19 DIAGNOSIS — R109 Unspecified abdominal pain: Secondary | ICD-10-CM | POA: Diagnosis not present

## 2017-02-09 DIAGNOSIS — Z23 Encounter for immunization: Secondary | ICD-10-CM | POA: Diagnosis not present

## 2017-08-16 DIAGNOSIS — Z1211 Encounter for screening for malignant neoplasm of colon: Secondary | ICD-10-CM | POA: Diagnosis not present

## 2017-08-16 DIAGNOSIS — Z1389 Encounter for screening for other disorder: Secondary | ICD-10-CM | POA: Diagnosis not present

## 2017-08-16 DIAGNOSIS — E7800 Pure hypercholesterolemia, unspecified: Secondary | ICD-10-CM | POA: Diagnosis not present

## 2017-08-16 DIAGNOSIS — Z125 Encounter for screening for malignant neoplasm of prostate: Secondary | ICD-10-CM | POA: Diagnosis not present

## 2017-08-16 DIAGNOSIS — M1990 Unspecified osteoarthritis, unspecified site: Secondary | ICD-10-CM | POA: Diagnosis not present

## 2017-08-16 DIAGNOSIS — Z0000 Encounter for general adult medical examination without abnormal findings: Secondary | ICD-10-CM | POA: Diagnosis not present

## 2017-08-16 DIAGNOSIS — H409 Unspecified glaucoma: Secondary | ICD-10-CM | POA: Diagnosis not present

## 2017-08-16 DIAGNOSIS — D696 Thrombocytopenia, unspecified: Secondary | ICD-10-CM | POA: Diagnosis not present

## 2017-08-16 DIAGNOSIS — K219 Gastro-esophageal reflux disease without esophagitis: Secondary | ICD-10-CM | POA: Diagnosis not present

## 2017-09-23 DIAGNOSIS — Z86010 Personal history of colonic polyps: Secondary | ICD-10-CM | POA: Diagnosis not present

## 2017-09-23 DIAGNOSIS — K219 Gastro-esophageal reflux disease without esophagitis: Secondary | ICD-10-CM | POA: Diagnosis not present

## 2017-09-23 DIAGNOSIS — K2980 Duodenitis without bleeding: Secondary | ICD-10-CM | POA: Diagnosis not present

## 2017-09-23 DIAGNOSIS — K228 Other specified diseases of esophagus: Secondary | ICD-10-CM | POA: Diagnosis not present

## 2017-09-23 DIAGNOSIS — D126 Benign neoplasm of colon, unspecified: Secondary | ICD-10-CM | POA: Diagnosis not present

## 2017-09-23 DIAGNOSIS — K2930 Chronic superficial gastritis without bleeding: Secondary | ICD-10-CM | POA: Diagnosis not present

## 2017-09-23 DIAGNOSIS — K648 Other hemorrhoids: Secondary | ICD-10-CM | POA: Diagnosis not present

## 2017-09-23 DIAGNOSIS — K5730 Diverticulosis of large intestine without perforation or abscess without bleeding: Secondary | ICD-10-CM | POA: Diagnosis not present

## 2017-09-30 DIAGNOSIS — K2930 Chronic superficial gastritis without bleeding: Secondary | ICD-10-CM | POA: Diagnosis not present

## 2017-09-30 DIAGNOSIS — D126 Benign neoplasm of colon, unspecified: Secondary | ICD-10-CM | POA: Diagnosis not present

## 2017-09-30 DIAGNOSIS — K2980 Duodenitis without bleeding: Secondary | ICD-10-CM | POA: Diagnosis not present

## 2017-09-30 DIAGNOSIS — K219 Gastro-esophageal reflux disease without esophagitis: Secondary | ICD-10-CM | POA: Diagnosis not present

## 2017-11-15 DIAGNOSIS — H2513 Age-related nuclear cataract, bilateral: Secondary | ICD-10-CM | POA: Diagnosis not present

## 2017-11-15 DIAGNOSIS — H04123 Dry eye syndrome of bilateral lacrimal glands: Secondary | ICD-10-CM | POA: Diagnosis not present

## 2017-12-17 DIAGNOSIS — L02821 Furuncle of head [any part, except face]: Secondary | ICD-10-CM | POA: Diagnosis not present

## 2017-12-17 DIAGNOSIS — L718 Other rosacea: Secondary | ICD-10-CM | POA: Diagnosis not present

## 2017-12-17 DIAGNOSIS — L821 Other seborrheic keratosis: Secondary | ICD-10-CM | POA: Diagnosis not present

## 2017-12-17 DIAGNOSIS — B9689 Other specified bacterial agents as the cause of diseases classified elsewhere: Secondary | ICD-10-CM | POA: Diagnosis not present

## 2018-01-05 IMAGING — NM NM MISC PROCEDURE
6 series · 36 of 36 positions shown · non-contrast
Comparison: none

[Series 1: wbr_r-proj_st rest · 6.51mm/px · 6 of 64 frames shown]
[frame 6/64]
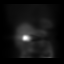
[frame 16/64]
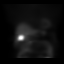
[frame 27/64]
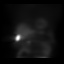
[frame 38/64]
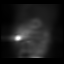
[frame 48/64]
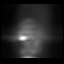
[frame 59/64]
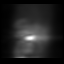

[Series 1: rest · 6.51mm/px · 6 of 64 frames shown]
[frame 6/64]
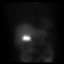
[frame 16/64]
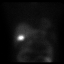
[frame 27/64]
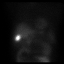
[frame 38/64]
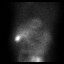
[frame 48/64]
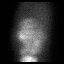
[frame 59/64]
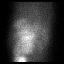

[Series 2: stress · 6.51mm/px · 6 of 512 frames shown (1 of 2)]
[frame 43/512]
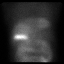
[frame 128/512]
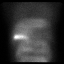
[frame 214/512]
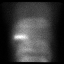
[frame 299/512]
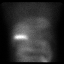
[frame 384/512]
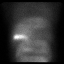
[frame 470/512]
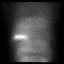

[Series 2: stress · 6.51mm/px · 6 of 64 frames shown (2 of 2)]
[frame 6/64]
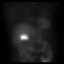
[frame 16/64]
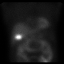
[frame 27/64]
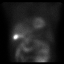
[frame 38/64]
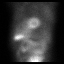
[frame 48/64]
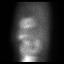
[frame 59/64]
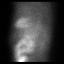

[Series 2: wbr_s-proj_st stress · 6.51mm/px · 6 of 64 frames shown (1 of 2)]
[frame 6/64]
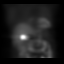
[frame 16/64]
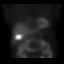
[frame 27/64]
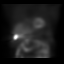
[frame 38/64]
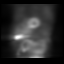
[frame 48/64]
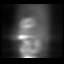
[frame 59/64]
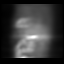

[Series 2: wbr_s-proj_st stress · 6.51mm/px · 6 of 512 frames shown (2 of 2)]
[frame 43/512]
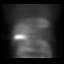
[frame 128/512]
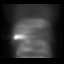
[frame 214/512]
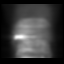
[frame 299/512]
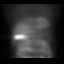
[frame 384/512]
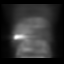
[frame 470/512]
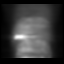

[36 of 36 positions shown; findings below may reference images not displayed]

Canned report from images found in remote index.

Refer to host system for actual result text.

## 2018-02-22 DIAGNOSIS — K219 Gastro-esophageal reflux disease without esophagitis: Secondary | ICD-10-CM | POA: Diagnosis not present

## 2018-02-22 DIAGNOSIS — K228 Other specified diseases of esophagus: Secondary | ICD-10-CM | POA: Diagnosis not present

## 2018-02-22 DIAGNOSIS — K269 Duodenal ulcer, unspecified as acute or chronic, without hemorrhage or perforation: Secondary | ICD-10-CM | POA: Diagnosis not present

## 2018-02-22 DIAGNOSIS — K449 Diaphragmatic hernia without obstruction or gangrene: Secondary | ICD-10-CM | POA: Diagnosis not present

## 2018-02-25 DIAGNOSIS — K219 Gastro-esophageal reflux disease without esophagitis: Secondary | ICD-10-CM | POA: Diagnosis not present

## 2018-03-03 DIAGNOSIS — Z23 Encounter for immunization: Secondary | ICD-10-CM | POA: Diagnosis not present

## 2019-03-27 DIAGNOSIS — Z23 Encounter for immunization: Secondary | ICD-10-CM | POA: Diagnosis not present

## 2019-11-27 DIAGNOSIS — H04123 Dry eye syndrome of bilateral lacrimal glands: Secondary | ICD-10-CM | POA: Diagnosis not present

## 2019-11-27 DIAGNOSIS — H2513 Age-related nuclear cataract, bilateral: Secondary | ICD-10-CM | POA: Diagnosis not present

## 2020-09-25 DIAGNOSIS — Z833 Family history of diabetes mellitus: Secondary | ICD-10-CM | POA: Diagnosis not present

## 2020-09-25 DIAGNOSIS — Z809 Family history of malignant neoplasm, unspecified: Secondary | ICD-10-CM | POA: Diagnosis not present

## 2020-09-25 DIAGNOSIS — R030 Elevated blood-pressure reading, without diagnosis of hypertension: Secondary | ICD-10-CM | POA: Diagnosis not present

## 2020-12-21 ENCOUNTER — Ambulatory Visit
Admission: EM | Admit: 2020-12-21 | Discharge: 2020-12-21 | Disposition: A | Discharge status: Home / Self Care | Payer: Medicare HMO | Attending: Internal Medicine | Admitting: Internal Medicine

## 2020-12-21 ENCOUNTER — Other Ambulatory Visit: Payer: Self-pay

## 2020-12-21 DIAGNOSIS — N401 Enlarged prostate with lower urinary tract symptoms: Secondary | ICD-10-CM | POA: Diagnosis not present

## 2020-12-21 LAB — POCT URINALYSIS DIP (MANUAL ENTRY)
Bilirubin, UA: NEGATIVE
Blood, UA: NEGATIVE
Glucose, UA: NEGATIVE mg/dL
Ketones, POC UA: NEGATIVE mg/dL
Leukocytes, UA: NEGATIVE
Nitrite, UA: NEGATIVE
Protein Ur, POC: NEGATIVE mg/dL
Spec Grav, UA: <=1.005 — AB (ref 1.010–1.025)
Urobilinogen, UA: 0.2 E.U./dL
pH, UA: 5.5 (ref 5.0–8.0)

## 2020-12-21 MED ORDER — TAMSULOSIN HCL 0.4 MG PO CAPS: 0.4000 mg | ORAL_CAPSULE | Freq: Every day | ORAL | 1 refills | Status: DC

## 2020-12-21 NOTE — ED Triage Notes (Signed)
Pt present urinary frequency with burning sensation while urinating along with dizziness. Symptom started on Monday.

## 2020-12-21 NOTE — ED Provider Notes (Signed)
[facility]    CSN: [id number] Arrival date & time: 12/21/20  0825      History   Chief Complaint Chief Complaint  Patient presents with   Urinary Tract Infection    HPI Dustin Berger is a 70 y.o. male comes to the urgent care with a few days history of urinary urgency, frequency, burning sensation when he urinates.  Patient's symptoms have been worsening over the course of the week.  He complains of straining at micturition, feeling of incomplete bladder emptying, dribbling and waking up more than 4 times at night to urinate.  He has a feeling of incomplete bladder emptying but no nausea or vomiting.  No flank pain.  No fever or chills.  No unintentional weight loss. HPI  Past Medical History:  Diagnosis Date   Diverticulosis    GERD (gastroesophageal reflux disease)    Hyperlipidemia     There are no problems to display for this patient.   Past Surgical History:  Procedure Laterality Date   APPENDECTOMY     APPENDECTOMY  2010   BACK SURGERY     LOWER BACK SURGERY  1997   MASS EXCISION  12/11/2011   Procedure: MINOR EXCISION OF MASS;  Surgeon: Cammie Sickle., MD;  Location: [facility];  Service: Orthopedics;  Laterality: Right;   Excision Mucoid Cyst Right Thumb; Interphalangeal Joint Debridement       Home Medications    Prior to Admission medications   Medication Sig Start Date End Date Taking? Authorizing Provider  tamsulosin (FLOMAX) 0.4 MG CAPS capsule Take 1 capsule (0.4 mg total) by mouth daily. 12/21/20  Yes Darrie Macmillan, Myrene Galas, MD  ranitidine (ZANTAC) 150 MG capsule Take 150 mg by mouth 2 (two) times daily.    [provider]    Family History History reviewed. No pertinent family history.  Social History Social History   Tobacco Use   Smoking status: Never  Substance Use Topics   Alcohol use: No   Drug use: No     Allergies   Patient has no known allergies.   Review of Systems Review of Systems   Constitutional:  Positive for fatigue.  HENT: Negative.    Neurological: Negative.     Physical Exam Triage Vital Signs ED Triage Vitals  Enc Vitals Group     BP 12/21/20 0904 (!) 146/86     Pulse Rate 12/21/20 0904 61     Resp 12/21/20 0904 16     Temp 12/21/20 0904 97.9 F (36.6 C)     Temp Source 12/21/20 0904 Oral     SpO2 12/21/20 0904 96 %     Weight --      Height --      Head Circumference --      Peak Flow --      Pain Score 12/21/20 0905 3     Pain Loc --      Pain Edu? --      Excl. in [place]? --    No data found.  Updated Vital Signs BP (!) 146/86 (BP Location: Right Arm)   Pulse 61   Temp 97.9 F (36.6 C) (Oral)   Resp 16   SpO2 96%   Visual Acuity Right Eye Distance:   Left Eye Distance:   Bilateral Distance:    Right Eye Near:   Left Eye Near:    Bilateral Near:     Physical Exam Vitals and nursing note reviewed.  Constitutional:  Appearance: Normal appearance.  Cardiovascular:     Rate and Rhythm: Normal rate and regular rhythm.  Abdominal:     General: Bowel sounds are normal.     Palpations: Abdomen is soft.  Musculoskeletal:        General: Normal range of motion.  Neurological:     Mental Status: He is alert.     UC Treatments / Results  Labs (all labs ordered are listed, but only abnormal results are displayed) Labs Reviewed  POCT URINALYSIS DIP (MANUAL ENTRY) - Abnormal; Notable for the following components:      Result Value   Spec Grav, UA <=1.005 (*)    All other components within normal limits  PSA    EKG   Radiology No results found.  Procedures Procedures (including critical care time)  Medications Ordered in UC Medications - No data to display  Initial Impression / Assessment and Plan / UC Course  I have reviewed the triage vital signs and the nursing notes.  Pertinent labs & imaging results that were available during my care of the patient were reviewed by me and considered in my medical decision  making (see chart for details).     1.  Lower urinary tract obstruction symptoms: PSA Tamsulosin Point-of-care urinalysis is negative for urinary tract infection Patient has an appointment with primary care physician September 30.  I encouraged patient to keep an appointment Return to urgent care if symptoms worsen. Final Clinical Impressions(s) / UC Diagnoses   Final diagnoses:  Benign prostatic hyperplasia with lower urinary tract symptoms, symptom details unspecified     Discharge Instructions      Please take medication as prescribed We will call you with labs if abnormal If your symptoms persist please return to urgent care to be reevaluated.   ED Prescriptions     Medication Sig Dispense Auth. Provider   tamsulosin (FLOMAX) 0.4 MG CAPS capsule Take 1 capsule (0.4 mg total) by mouth daily. 30 capsule Dustin Berger, Myrene Galas, MD      PDMP not reviewed this encounter.   Chase Picket, MD 12/21/20 1016

## 2020-12-21 NOTE — Discharge Instructions (Addendum)
Please take medication as prescribed We will call you with labs if abnormal If your symptoms persist please return to urgent care to be reevaluated.

## 2020-12-22 LAB — PSA: Prostate Specific Ag, Serum: 0.8 ng/mL (ref 0.0–4.0)

## 2020-12-25 DIAGNOSIS — K269 Duodenal ulcer, unspecified as acute or chronic, without hemorrhage or perforation: Secondary | ICD-10-CM | POA: Diagnosis not present

## 2020-12-25 DIAGNOSIS — R69 Illness, unspecified: Secondary | ICD-10-CM | POA: Diagnosis not present

## 2020-12-25 DIAGNOSIS — R42 Dizziness and giddiness: Secondary | ICD-10-CM | POA: Diagnosis not present

## 2020-12-25 DIAGNOSIS — N400 Enlarged prostate without lower urinary tract symptoms: Secondary | ICD-10-CM | POA: Diagnosis not present

## 2020-12-31 DIAGNOSIS — Z0000 Encounter for general adult medical examination without abnormal findings: Secondary | ICD-10-CM | POA: Diagnosis not present

## 2020-12-31 DIAGNOSIS — E7800 Pure hypercholesterolemia, unspecified: Secondary | ICD-10-CM | POA: Diagnosis not present

## 2020-12-31 DIAGNOSIS — Z1389 Encounter for screening for other disorder: Secondary | ICD-10-CM | POA: Diagnosis not present

## 2020-12-31 DIAGNOSIS — M1990 Unspecified osteoarthritis, unspecified site: Secondary | ICD-10-CM | POA: Diagnosis not present

## 2020-12-31 DIAGNOSIS — N400 Enlarged prostate without lower urinary tract symptoms: Secondary | ICD-10-CM | POA: Diagnosis not present

## 2020-12-31 DIAGNOSIS — R69 Illness, unspecified: Secondary | ICD-10-CM | POA: Diagnosis not present

## 2020-12-31 DIAGNOSIS — K219 Gastro-esophageal reflux disease without esophagitis: Secondary | ICD-10-CM | POA: Diagnosis not present

## 2021-02-24 DIAGNOSIS — H2513 Age-related nuclear cataract, bilateral: Secondary | ICD-10-CM | POA: Diagnosis not present

## 2021-02-24 DIAGNOSIS — H04123 Dry eye syndrome of bilateral lacrimal glands: Secondary | ICD-10-CM | POA: Diagnosis not present

## 2021-06-11 DIAGNOSIS — C44311 Basal cell carcinoma of skin of nose: Secondary | ICD-10-CM | POA: Diagnosis not present

## 2021-06-11 DIAGNOSIS — L718 Other rosacea: Secondary | ICD-10-CM | POA: Diagnosis not present

## 2021-06-12 DIAGNOSIS — R109 Unspecified abdominal pain: Secondary | ICD-10-CM | POA: Diagnosis not present

## 2021-06-12 DIAGNOSIS — R8290 Unspecified abnormal findings in urine: Secondary | ICD-10-CM | POA: Diagnosis not present

## 2021-07-10 DIAGNOSIS — W57XXXA Bitten or stung by nonvenomous insect and other nonvenomous arthropods, initial encounter: Secondary | ICD-10-CM | POA: Diagnosis not present

## 2021-07-10 DIAGNOSIS — L539 Erythematous condition, unspecified: Secondary | ICD-10-CM | POA: Diagnosis not present

## 2021-07-10 DIAGNOSIS — S30861A Insect bite (nonvenomous) of abdominal wall, initial encounter: Secondary | ICD-10-CM | POA: Diagnosis not present

## 2021-07-15 DIAGNOSIS — C44311 Basal cell carcinoma of skin of nose: Secondary | ICD-10-CM | POA: Diagnosis not present

## 2021-08-04 ENCOUNTER — Ambulatory Visit
Admission: RE | Admit: 2021-08-04 | Discharge: 2021-08-04 | Disposition: A | Discharge status: Home / Self Care | Payer: Medicare HMO | Source: Ambulatory Visit | Attending: Internal Medicine | Admitting: Internal Medicine

## 2021-08-04 ENCOUNTER — Other Ambulatory Visit: Payer: Self-pay | Admitting: Internal Medicine

## 2021-08-04 DIAGNOSIS — M545 Low back pain, unspecified: Secondary | ICD-10-CM | POA: Diagnosis not present

## 2021-08-04 DIAGNOSIS — G8929 Other chronic pain: Secondary | ICD-10-CM

## 2021-08-04 DIAGNOSIS — M47816 Spondylosis without myelopathy or radiculopathy, lumbar region: Secondary | ICD-10-CM | POA: Diagnosis not present

## 2021-10-14 DIAGNOSIS — Z08 Encounter for follow-up examination after completed treatment for malignant neoplasm: Secondary | ICD-10-CM | POA: Diagnosis not present

## 2021-10-14 DIAGNOSIS — Z85828 Personal history of other malignant neoplasm of skin: Secondary | ICD-10-CM | POA: Diagnosis not present

## 2021-10-14 DIAGNOSIS — D225 Melanocytic nevi of trunk: Secondary | ICD-10-CM | POA: Diagnosis not present

## 2022-01-02 DIAGNOSIS — Z8719 Personal history of other diseases of the digestive system: Secondary | ICD-10-CM | POA: Diagnosis not present

## 2022-01-02 DIAGNOSIS — Z23 Encounter for immunization: Secondary | ICD-10-CM | POA: Diagnosis not present

## 2022-01-02 DIAGNOSIS — Z0000 Encounter for general adult medical examination without abnormal findings: Secondary | ICD-10-CM | POA: Diagnosis not present

## 2022-01-02 DIAGNOSIS — K219 Gastro-esophageal reflux disease without esophagitis: Secondary | ICD-10-CM | POA: Diagnosis not present

## 2022-01-02 DIAGNOSIS — Z1331 Encounter for screening for depression: Secondary | ICD-10-CM | POA: Diagnosis not present

## 2022-01-02 DIAGNOSIS — R69 Illness, unspecified: Secondary | ICD-10-CM | POA: Diagnosis not present

## 2022-01-02 DIAGNOSIS — E7800 Pure hypercholesterolemia, unspecified: Secondary | ICD-10-CM | POA: Diagnosis not present

## 2022-01-02 DIAGNOSIS — H409 Unspecified glaucoma: Secondary | ICD-10-CM | POA: Diagnosis not present

## 2022-01-02 DIAGNOSIS — D696 Thrombocytopenia, unspecified: Secondary | ICD-10-CM | POA: Diagnosis not present

## 2022-01-02 DIAGNOSIS — N400 Enlarged prostate without lower urinary tract symptoms: Secondary | ICD-10-CM | POA: Diagnosis not present

## 2022-04-08 ENCOUNTER — Encounter (HOSPITAL_BASED_OUTPATIENT_CLINIC_OR_DEPARTMENT_OTHER): Payer: Self-pay | Admitting: Emergency Medicine

## 2022-04-08 ENCOUNTER — Other Ambulatory Visit: Payer: Self-pay

## 2022-04-08 ENCOUNTER — Emergency Department (HOSPITAL_BASED_OUTPATIENT_CLINIC_OR_DEPARTMENT_OTHER)
Admission: EM | Admit: 2022-04-08 | Discharge: 2022-04-09 | Discharge status: Against Medical Advice | Payer: Medicare HMO | Attending: Emergency Medicine | Admitting: Emergency Medicine

## 2022-04-08 DIAGNOSIS — K219 Gastro-esophageal reflux disease without esophagitis: Secondary | ICD-10-CM | POA: Diagnosis not present

## 2022-04-08 DIAGNOSIS — Z5321 Procedure and treatment not carried out due to patient leaving prior to being seen by health care provider: Secondary | ICD-10-CM | POA: Diagnosis not present

## 2022-04-08 DIAGNOSIS — I10 Essential (primary) hypertension: Secondary | ICD-10-CM | POA: Insufficient documentation

## 2022-04-08 DIAGNOSIS — Z008 Encounter for other general examination: Secondary | ICD-10-CM | POA: Diagnosis not present

## 2022-04-08 DIAGNOSIS — Z6830 Body mass index (BMI) 30.0-30.9, adult: Secondary | ICD-10-CM | POA: Diagnosis not present

## 2022-04-08 DIAGNOSIS — R32 Unspecified urinary incontinence: Secondary | ICD-10-CM | POA: Diagnosis not present

## 2022-04-08 DIAGNOSIS — E669 Obesity, unspecified: Secondary | ICD-10-CM | POA: Diagnosis not present

## 2022-04-08 DIAGNOSIS — R030 Elevated blood-pressure reading, without diagnosis of hypertension: Secondary | ICD-10-CM | POA: Diagnosis not present

## 2022-04-08 DIAGNOSIS — N529 Male erectile dysfunction, unspecified: Secondary | ICD-10-CM | POA: Diagnosis not present

## 2022-04-08 DIAGNOSIS — G8929 Other chronic pain: Secondary | ICD-10-CM | POA: Diagnosis not present

## 2022-04-08 DIAGNOSIS — Z833 Family history of diabetes mellitus: Secondary | ICD-10-CM | POA: Diagnosis not present

## 2022-04-08 NOTE — ED Triage Notes (Signed)
Pt c/o high blood pressure today, states that it was in the [phone] systolic by a home health nurse today. Pt states that he does not take any medications for hypertension and he did not want to wait until tomorrow to see his PCP.

## 2022-04-09 DIAGNOSIS — Z8719 Personal history of other diseases of the digestive system: Secondary | ICD-10-CM | POA: Diagnosis not present

## 2022-04-09 DIAGNOSIS — R0609 Other forms of dyspnea: Secondary | ICD-10-CM | POA: Diagnosis not present

## 2022-04-09 DIAGNOSIS — R030 Elevated blood-pressure reading, without diagnosis of hypertension: Secondary | ICD-10-CM | POA: Diagnosis not present

## 2022-04-09 DIAGNOSIS — H8110 Benign paroxysmal vertigo, unspecified ear: Secondary | ICD-10-CM | POA: Diagnosis not present

## 2022-04-09 NOTE — ED Notes (Signed)
Pt stated that he waited 5 hours and that was long enough. Pt left ED

## 2022-04-10 ENCOUNTER — Encounter (HOSPITAL_COMMUNITY): Payer: Self-pay

## 2022-04-10 ENCOUNTER — Emergency Department (HOSPITAL_COMMUNITY)
Admission: EM | Admit: 2022-04-10 | Discharge: 2022-04-11 | Disposition: A | Discharge status: Home / Self Care | Payer: Medicare HMO | Attending: Emergency Medicine | Admitting: Emergency Medicine

## 2022-04-10 ENCOUNTER — Emergency Department (HOSPITAL_COMMUNITY): Payer: Medicare HMO

## 2022-04-10 ENCOUNTER — Other Ambulatory Visit: Payer: Self-pay

## 2022-04-10 DIAGNOSIS — R42 Dizziness and giddiness: Secondary | ICD-10-CM | POA: Diagnosis not present

## 2022-04-10 DIAGNOSIS — I10 Essential (primary) hypertension: Secondary | ICD-10-CM | POA: Insufficient documentation

## 2022-04-10 DIAGNOSIS — Z79899 Other long term (current) drug therapy: Secondary | ICD-10-CM | POA: Insufficient documentation

## 2022-04-10 LAB — CBC
HCT: 46.7 % (ref 39.0–52.0)
Hemoglobin: 15.5 g/dL (ref 13.0–17.0)
MCH: 30.8 pg (ref 26.0–34.0)
MCHC: 33.2 g/dL (ref 30.0–36.0)
MCV: 92.7 fL (ref 80.0–100.0)
Platelets: 161 10*3/uL (ref 150–400)
RBC: 5.04 MIL/uL (ref 4.22–5.81)
RDW: 12.7 % (ref 11.5–15.5)
WBC: 6.5 10*3/uL (ref 4.0–10.5)
nRBC: 0 % (ref 0.0–0.2)

## 2022-04-10 LAB — COMPREHENSIVE METABOLIC PANEL
ALT: 24 U/L (ref 0–44)
AST: 29 U/L (ref 15–41)
Albumin: 3.9 g/dL (ref 3.5–5.0)
Alkaline Phosphatase: 63 U/L (ref 38–126)
Anion gap: 11 (ref 5–15)
BUN: 17 mg/dL (ref 8–23)
CO2: 24 mmol/L (ref 22–32)
Calcium: 8.8 mg/dL — ABNORMAL LOW (ref 8.9–10.3)
Chloride: 103 mmol/L (ref 98–111)
Creatinine, Ser: 1.21 mg/dL (ref 0.61–1.24)
GFR, Estimated: >60 mL/min (ref >60)
Glucose, Bld: 109 mg/dL — ABNORMAL HIGH (ref 70–99)
Potassium: 3.8 mmol/L (ref 3.5–5.1)
Sodium: 138 mmol/L (ref 135–145)
Total Bilirubin: 0.7 mg/dL (ref 0.3–1.2)
Total Protein: 7.7 g/dL (ref 6.5–8.1)

## 2022-04-10 MED ORDER — HYDROCHLOROTHIAZIDE 25 MG PO TABS
25.0000 mg | ORAL_TABLET | Freq: Once | ORAL | Status: AC
  Administered 2022-04-11: 25 mg via ORAL
  Filled 2022-04-10: qty 1

## 2022-04-10 NOTE — ED Triage Notes (Signed)
Pt presents with elevated B/P and reports a systolic of 117. Pt reports associated light headed symptoms and flushed in the face. Pt does not currently take B/P medication.

## 2022-04-10 NOTE — ED Notes (Signed)
Pt ambulatory from ED lobby to room 4- steady gait noted, no reports of dizziness

## 2022-04-11 MED ORDER — HYDROCHLOROTHIAZIDE 25 MG PO TABS: 25.0000 mg | ORAL_TABLET | Freq: Every day | ORAL | 2 refills | Status: DC

## 2022-04-11 NOTE — ED Provider Notes (Signed)
[facility] EMERGENCY DEPARTMENT Provider Note   CSN: [id number] Arrival date & time: 04/10/22  1901     History  Chief complaint - hypertension  Dustin Berger is a 71 y.o. male.  The history is provided by the patient and a relative.   Patient presents for multiple complaints.  Patient reports has been checking his blood pressure for the past week and it has been elevated.  He has checked it multiple times per day and it is not improving.  He went to the local ER recently to be evaluated, but left before being seen.  He saw his PCP in the interim, and was told he had vertigo and placed on medications.  He was not started on any BP meds.  He has not been on hypertension meds previously. He reports mild episodes of dizziness with head movement.  He is able to go about his day at work outside in the yard without difficulty.  No focal weakness.  No visual loss.  He has had mild headache.  No chest pain No previous history of CVA. He reports he feels flushed at times.  He has some fatigue.    Home Medications Prior to Admission medications   Medication Sig Start Date End Date Taking? Authorizing Provider  hydrochlorothiazide (HYDRODIURIL) 25 MG tablet Take 1 tablet (25 mg total) by mouth daily. 04/10/22  Yes Ripley Fraise, MD  ranitidine (ZANTAC) 150 MG capsule Take 150 mg by mouth 2 (two) times daily.    [provider]  tamsulosin (FLOMAX) 0.4 MG CAPS capsule Take 1 capsule (0.4 mg total) by mouth daily. 12/21/20   LampteyMyrene Galas, MD      Allergies    Patient has no known allergies.    Review of Systems   Review of Systems  Constitutional:  Positive for fatigue. Negative for fever.  Eyes:        Denies visual loss  Cardiovascular:  Negative for chest pain.  Gastrointestinal:  Negative for abdominal pain.  Neurological:  Positive for dizziness and headaches. Negative for syncope, speech difficulty and weakness.    Physical Exam Updated Vital Signs BP (!)  169/95   Pulse (!) 57   Temp 98.2 F (36.8 C) (Oral)   Resp 18   Ht 1.803 m ('5\' 11"'$ )   Wt 95.3 kg   SpO2 97%   BMI 29.29 kg/m  Physical Exam CONSTITUTIONAL: Well developed/well nourished HEAD: Normocephalic/atraumatic EYES: EOMI/PERRL, no nystagmus, no visual field deficit  no ptosis ENMT: Mucous membranes moist NECK: supple no meningeal signs, no bruits CV: S1/S2 noted, no murmurs/rubs/gallops noted LUNGS: Lungs are clear to auscultation bilaterally, no apparent distress ABDOMEN: soft, nontender, no rebound or guarding GU:no cva tenderness NEURO:Awake/alert, face symmetric, no arm or leg drift is noted Equal 5/5 strength with shoulder abduction, elbow flex/extension, wrist flex/extension in upper extremities and equal hand grips bilaterally Equal 5/5 strength with hip flexion,knee flex/extension, foot dorsi/plantar flexion Cranial nerves 3/4/5/6/11/09/08/11/12 tested and intact Gait normal without ataxia No past pointing Sensation to light touch intact in all extremities EXTREMITIES: pulses normal, full ROM SKIN: warm, color normal PSYCH: no abnormalities of mood noted  ED Results / Procedures / Treatments   Labs (all labs ordered are listed, but only abnormal results are displayed) Labs Reviewed  COMPREHENSIVE METABOLIC PANEL - Abnormal; Notable for the following components:      Result Value   Glucose, Bld 109 (*)    Calcium 8.8 (*)    All other components within  normal limits  CBC    EKG EKG Interpretation  Date/Time:  Friday April 10 2022 19:16:46 EST Ventricular Rate:  82 PR Interval:  208 QRS Duration: 82 QT Interval:  366 QTC Calculation: 427 R Axis:   67 Text Interpretation: Normal sinus rhythm with sinus arrhythmia Cannot rule out Anterior infarct , age undetermined Abnormal ECG Confirmed by Ripley Fraise ([id number]) on 04/10/2022 11:07:44 PM  Radiology CT Head Wo Contrast  Result Date: 04/10/2022 CLINICAL DATA:  Central vertigo. EXAM: CT HEAD  WITHOUT CONTRAST TECHNIQUE: Contiguous axial images were obtained from the base of the skull through the vertex without intravenous contrast. RADIATION DOSE REDUCTION: This exam was performed according to the departmental dose-optimization program which includes automated exposure control, adjustment of the mA and/or kV according to patient size and/or use of iterative reconstruction technique. COMPARISON:  None Available. FINDINGS: Brain: No acute intracranial hemorrhage, midline shift or mass effect. No extra-axial fluid collection. Gray-white matter differentiation is within normal limits. No hydrocephalus. Vascular: No hyperdense vessel or unexpected calcification. Skull: Normal. Negative for fracture or focal lesion. Sinuses/Orbits: No acute finding. Other: None. IMPRESSION: No acute intracranial process. Electronically Signed   By: Brett Fairy M.D.   On: 04/10/2022 23:52    Procedures Procedures    Medications Ordered in ED Medications  hydrochlorothiazide (HYDRODIURIL) tablet 25 mg (has no administration in time range)    ED Course/ Medical Decision Making/ A&P                           Medical Decision Making Amount and/or Complexity of Data Reviewed Radiology: ordered.  Risk Prescription drug management.   This patient presents to the ED for concern of hypertension and dizziness, this involves an extensive number of treatment options, and is a complaint that carries with it a high risk of complications and morbidity.  The differential diagnosis includes but is not limited to CVA, intracranial hemorrhage, acute coronary syndrome, renal failure, urinary tract infection, electrolyte disturbance, pneumonia    Comorbidities that complicate the patient evaluation: Patient's presentation is complicated by their history of BPH  Social Determinants of Health: Patient's  lives alone   increases the complexity of managing their presentation  Additional history obtained: Additional  history obtained from family  Lab Tests: I Ordered, and personally interpreted labs.  The pertinent results include: Labs unremarkable  Imaging Studies ordered: I ordered imaging studies including CT scan head   I independently visualized and interpreted imaging which showed no acute findings I agree with the radiologist interpretation   Medicines ordered and prescription drug management: I ordered medication including hydrochlorothiazide for blood pressure elevated  Test Considered: Considered MRI, but patient without any neurodeficits and ambulatory, low suspicion for acute CVA   Reevaluation: After the interventions noted above, I reevaluated the patient and found that they have :stayed the same  Complexity of problems addressed: Patient's presentation is most consistent with  acute presentation with potential threat to life or bodily function  Disposition: After consideration of the diagnostic results and the patient's response to treatment,  I feel that the patent would benefit from discharge   .    I suspect most the patient's symptoms are due to uncontrolled high blood pressure.  He has no acute neurodeficits here in the emergency department.  He has very mild dizziness with head turning.  No nystagmus on exam.  No ataxia.  No hearing changes CT head performed for screening purposes, I have low  suspicion for occult CVA at this time.  Will start him on BP meds, close follow-up with PCP. We discussed strict return precautions       Final Clinical Impression(s) / ED Diagnoses Final diagnoses:  Uncontrolled hypertension  Dizziness    Rx / DC Orders ED Discharge Orders          Ordered    hydrochlorothiazide (HYDRODIURIL) 25 MG tablet  Daily        04/11/22 0000              Ripley Fraise, MD 04/11/22 0006

## 2022-04-11 NOTE — ED Provider Notes (Incomplete)
[facility] EMERGENCY DEPARTMENT Provider Note   CSN: [id number] Arrival date & time: 04/10/22  1901     History {Add pertinent medical, surgical, social history, OB history to HPI:1} Chief complaint - hypertension  Dustin Berger is a 71 y.o. male.  The history is provided by the patient and a relative.   Patient presents for multiple complaints.  Patient reports has been checking his blood pressure for the past week and it has been elevated.  He has checked it multiple times per day and it is not improving.  He went to the local ER recently to be evaluated, but left before being seen.  He saw his PCP in the interim, and was told he had vertigo and placed on medications.  He was not started on any BP meds.  He has not been on hypertension meds previously. He reports mild episodes of dizziness with head movement.  He is able to go about his day at work outside in the yard without difficulty.  No focal weakness.  No visual loss.  He has had mild headache.  No chest pain No previous history of CVA. He reports he feels flushed at times.  He has some fatigue.    Home Medications Prior to Admission medications   Medication Sig Start Date End Date Taking? Authorizing Provider  hydrochlorothiazide (HYDRODIURIL) 25 MG tablet Take 1 tablet (25 mg total) by mouth daily. 04/10/22  Yes Ripley Fraise, MD  ranitidine (ZANTAC) 150 MG capsule Take 150 mg by mouth 2 (two) times daily.    [provider]  tamsulosin (FLOMAX) 0.4 MG CAPS capsule Take 1 capsule (0.4 mg total) by mouth daily. 12/21/20   LampteyMyrene Galas, MD      Allergies    Patient has no known allergies.    Review of Systems   Review of Systems  Constitutional:  Positive for fatigue. Negative for fever.  Eyes:        Denies visual loss  Cardiovascular:  Negative for chest pain.  Gastrointestinal:  Negative for abdominal pain.  Neurological:  Positive for dizziness and headaches. Negative for syncope, speech  difficulty and weakness.    Physical Exam Updated Vital Signs BP (!) 169/95   Pulse (!) 57   Temp 98.2 F (36.8 C) (Oral)   Resp 18   Ht 1.803 m ('5\' 11"'$ )   Wt 95.3 kg   SpO2 97%   BMI 29.29 kg/m  Physical Exam CONSTITUTIONAL: Well developed/well nourished HEAD: Normocephalic/atraumatic EYES: EOMI/PERRL, no nystagmus, no visual field deficit  no ptosis ENMT: Mucous membranes moist NECK: supple no meningeal signs, no bruits CV: S1/S2 noted, no murmurs/rubs/gallops noted LUNGS: Lungs are clear to auscultation bilaterally, no apparent distress ABDOMEN: soft, nontender, no rebound or guarding GU:no cva tenderness NEURO:Awake/alert, face symmetric, no arm or leg drift is noted Equal 5/5 strength with shoulder abduction, elbow flex/extension, wrist flex/extension in upper extremities and equal hand grips bilaterally Equal 5/5 strength with hip flexion,knee flex/extension, foot dorsi/plantar flexion Cranial nerves 3/4/5/6/11/09/08/11/12 tested and intact Gait normal without ataxia No past pointing Sensation to light touch intact in all extremities EXTREMITIES: pulses normal, full ROM SKIN: warm, color normal PSYCH: no abnormalities of mood noted  ED Results / Procedures / Treatments   Labs (all labs ordered are listed, but only abnormal results are displayed) Labs Reviewed  COMPREHENSIVE METABOLIC PANEL - Abnormal; Notable for the following components:      Result Value   Glucose, Bld 109 (*)    Calcium  8.8 (*)    All other components within normal limits  CBC    EKG EKG Interpretation  Date/Time:  Friday April 10 2022 19:16:46 EST Ventricular Rate:  82 PR Interval:  208 QRS Duration: 82 QT Interval:  366 QTC Calculation: 427 R Axis:   67 Text Interpretation: Normal sinus rhythm with sinus arrhythmia Cannot rule out Anterior infarct , age undetermined Abnormal ECG Confirmed by Ripley Fraise ([id number]) on 04/10/2022 11:07:44 PM  Radiology CT Head Wo  Contrast  Result Date: 04/10/2022 CLINICAL DATA:  Central vertigo. EXAM: CT HEAD WITHOUT CONTRAST TECHNIQUE: Contiguous axial images were obtained from the base of the skull through the vertex without intravenous contrast. RADIATION DOSE REDUCTION: This exam was performed according to the departmental dose-optimization program which includes automated exposure control, adjustment of the mA and/or kV according to patient size and/or use of iterative reconstruction technique. COMPARISON:  None Available. FINDINGS: Brain: No acute intracranial hemorrhage, midline shift or mass effect. No extra-axial fluid collection. Gray-white matter differentiation is within normal limits. No hydrocephalus. Vascular: No hyperdense vessel or unexpected calcification. Skull: Normal. Negative for fracture or focal lesion. Sinuses/Orbits: No acute finding. Other: None. IMPRESSION: No acute intracranial process. Electronically Signed   By: Brett Fairy M.D.   On: 04/10/2022 23:52    Procedures Procedures  {Document cardiac monitor, telemetry assessment procedure when appropriate:1}  Medications Ordered in ED Medications  hydrochlorothiazide (HYDRODIURIL) tablet 25 mg (has no administration in time range)    ED Course/ Medical Decision Making/ A&P                           Medical Decision Making Amount and/or Complexity of Data Reviewed Radiology: ordered.  Risk Prescription drug management.   This patient presents to the ED for concern of hypertension and dizziness, this involves an extensive number of treatment options, and is a complaint that carries with it a high risk of complications and morbidity.  The differential diagnosis includes but is not limited to CVA, intracranial hemorrhage, acute coronary syndrome, renal failure, urinary tract infection, electrolyte disturbance, pneumonia    Comorbidities that complicate the patient evaluation: Patient's presentation is complicated by their history of  BPH  Social Determinants of Health: Patient's ***  increases the complexity of managing their presentation  Additional history obtained: Additional history obtained from {additional history:26846} Records reviewed {records:26847}  Lab Tests: I Ordered, and personally interpreted labs.  The pertinent results include:  ***  Imaging Studies ordered: I ordered imaging studies including {imaging:26848}  I independently visualized and interpreted imaging which showed *** I agree with the radiologist interpretation  Cardiac Monitoring: The patient was maintained on a cardiac monitor.  I personally viewed and interpreted the cardiac monitor which showed an underlying rhythm of:  {cardiac monitor:26849}  Medicines ordered and prescription drug management: I ordered medication including ***  for ***  Reevaluation of the patient after these medicines showed that the patient    {resolved/improved/worsened:23923::"improved"}  Test Considered: Patient is low risk / negative by ***, therefore do not feel that *** is indicated.  Critical Interventions:  .***  Consultations Obtained: I requested consultation with the {consultation:26851}, and discussed  findings as well as pertinent plan - they recommend: ***  Reevaluation: After the interventions noted above, I reevaluated the patient and found that they have :{resolved/improved/worsened:23923::"improved"}  Complexity of problems addressed: Patient's presentation is most consistent with  {OVFI:43329}  Disposition: After consideration of the diagnostic results and the patient's response  to treatment,  I feel that the patent would benefit from {disposition:26850}.     {Document critical care time when appropriate:1} {Document review of labs and clinical decision tools ie heart score, Chads2Vasc2 etc:1}  {Document your independent review of radiology images, and any outside records:1} {Document your discussion with family members,  caretakers, and with consultants:1} {Document social determinants of health affecting pt's care:1} {Document your decision making why or why not admission, treatments were needed:1} Final Clinical Impression(s) / ED Diagnoses Final diagnoses:  Uncontrolled hypertension  Dizziness    Rx / DC Orders ED Discharge Orders          Ordered    hydrochlorothiazide (HYDRODIURIL) 25 MG tablet  Daily        04/11/22 0000

## 2022-04-21 DIAGNOSIS — I10 Essential (primary) hypertension: Secondary | ICD-10-CM | POA: Diagnosis not present

## 2022-04-21 DIAGNOSIS — H8110 Benign paroxysmal vertigo, unspecified ear: Secondary | ICD-10-CM | POA: Diagnosis not present

## 2022-06-02 ENCOUNTER — Ambulatory Visit: Payer: Medicare HMO | Attending: Cardiology | Admitting: Cardiology

## 2022-06-02 ENCOUNTER — Encounter: Payer: Self-pay | Admitting: Cardiology

## 2022-06-02 VITALS — BP 110/70 | HR 88 | Ht 71.0 in | Wt 207.6 lb

## 2022-06-02 DIAGNOSIS — R0602 Shortness of breath: Secondary | ICD-10-CM

## 2022-06-02 DIAGNOSIS — I10 Essential (primary) hypertension: Secondary | ICD-10-CM | POA: Diagnosis not present

## 2022-06-02 MED ORDER — METOPROLOL TARTRATE 50 MG PO TABS: 50.0000 mg | ORAL_TABLET | Freq: Once | ORAL | 0 refills | Status: DC

## 2022-06-02 MED ORDER — IVABRADINE HCL 7.5 MG PO TABS: 7.5000 mg | ORAL_TABLET | Freq: Once | ORAL | 0 refills | Status: AC

## 2022-06-02 NOTE — Progress Notes (Signed)
Cardiology CONSULT Note    Date:  06/02/2022   ID:  Dustin Berger, DOB [birth year], MRN [id number]  PCP:  Wenda Low, MD  Cardiologist:  Fransico Him, MD   Chief Complaint  Patient presents with   New Patient (Initial Visit)    SOB    History of Present Illness:  Dustin Berger is a 72 y.o. male who is being seen today for the evaluation of Shortness of breath at the request of Wenda Low, MD.  This is a 72 year old male with a history of anxiety, GERD, hyperlipidemia who is referred for evaluation of shortness of breath. He tells me he started having problems with HTN and was started on HCTZ and then Losartan and HCTZ was stopped.  He then started having DOE a few months ago and noticed mainly when walking.  He tells me that he was sedentary and then after being dx with HTN he started walking and noticed the SOB.  He walks 2-6 miles a day and has not noticed any chest pain or pressure but has had SOB.  As he has continued to walk the SOB has improved.  He has changed his diet and he says that he feels better now than he has in a long time.  He denies any LE edema, dizziness (except for a bout of vertigo), palpitations, PND, orthopnea or syncope.   Past Medical History:  Diagnosis Date   Anxiety    BPH (benign prostatic hyperplasia)    BPV (benign positional vertigo)    Diverticulosis    Duodenal ulcer    First degree heart block    GERD (gastroesophageal reflux disease)    Glaucoma    History of adenomatous polyp of colon    Hypercholesteremia    Osteoarthritis    Other chronic pain    Sinus bradycardia    Thrombocytopenia ([place])     Past Surgical History:  Procedure Laterality Date   APPENDECTOMY     APPENDECTOMY  2010   BACK SURGERY     LOWER BACK SURGERY  1997   MASS EXCISION  12/11/2011   Procedure: MINOR EXCISION OF MASS;  Surgeon: Cammie Sickle., MD;  Location: [place];  Service: Orthopedics;  Laterality: Right;   Excision  Mucoid Cyst Right Thumb; Interphalangeal Joint Debridement    Current Medications: Current Meds  Medication Sig   acetaminophen (TYLENOL) 325 MG tablet Take 650 mg by mouth every 6 (six) hours as needed.   clonazePAM (KLONOPIN) 0.5 MG tablet Take 0.5 mg by mouth 2 (two) times daily as needed for anxiety.   losartan (COZAAR) 25 MG tablet Take 25 mg by mouth daily.   pantoprazole (PROTONIX) 40 MG tablet Take 40 mg by mouth daily.   [DISCONTINUED] hydrochlorothiazide (HYDRODIURIL) 25 MG tablet Take 1 tablet (25 mg total) by mouth daily.   [DISCONTINUED] meclizine (ANTIVERT) 25 MG tablet Take 25 mg by mouth 3 (three) times daily as needed for dizziness.   [DISCONTINUED] ranitidine (ZANTAC) 150 MG capsule Take 150 mg by mouth 2 (two) times daily.   [DISCONTINUED] tamsulosin (FLOMAX) 0.4 MG CAPS capsule Take 1 capsule (0.4 mg total) by mouth daily.    Allergies:   Patient has no known allergies.   Social History   Socioeconomic History   Marital status: Widowed    Spouse name: Not on file   Number of children: Not on file   Years of education: Not on file   Highest education level:  Not on file  Occupational History   Not on file  Tobacco Use   Smoking status: Never   Smokeless tobacco: Not on file  Vaping Use   Vaping Use: Never used  Substance and Sexual Activity   Alcohol use: No   Drug use: No   Sexual activity: Not on file  Other Topics Concern   Not on file  Social History Narrative   Not on file   Social Determinants of Health   Financial Resource Strain: Not on file  Food Insecurity: Not on file  Transportation Needs: Not on file  Physical Activity: Not on file  Stress: Not on file  Social Connections: Not on file     Family History:  The patient's family history includes Cancer in his sister; Diabetes in his mother.   ROS:   Please see the history of present illness.    ROS All other systems reviewed and are negative.      No data to display              PHYSICAL EXAM:   VS:  BP 110/70   Pulse 88   Ht '5\' 11"'$  (1.803 m)   Wt 207 lb 9.6 oz (94.2 kg)   SpO2 94%   BMI 28.95 kg/m    GEN: Well nourished, well developed, in no acute distress  HEENT: normal  Neck: no JVD, carotid bruits, or masses Cardiac: RRR; no murmurs, rubs, or gallops,no edema.  Intact distal pulses bilaterally.  Respiratory:  clear to auscultation bilaterally, normal work of breathing GI: soft, nontender, nondistended, + BS MS: no deformity or atrophy  Skin: warm and dry, no rash Neuro:  Alert and Oriented x 3, Strength and sensation are intact Psych: euthymic mood, full affect  Wt Readings from Last 3 Encounters:  06/02/22 207 lb 9.6 oz (94.2 kg)  04/10/22 210 lb (95.3 kg)  04/08/22 210 lb (95.3 kg)      Studies/Labs Reviewed:   EKG:  EKG is not ordered today.    Recent Labs: 04/10/2022: ALT 24; BUN 17; Creatinine, Ser 1.21; Hemoglobin 15.5; Platelets 161; Potassium 3.8; Sodium 138   Lipid Panel No results found for: "CHOL", "TRIG", "HDL", "CHOLHDL", "VLDL", "LDLCALC", "LDLDIRECT"     ASSESSMENT:    1. SOB (shortness of breath)   2. Primary hypertension      PLAN:  In order of problems listed above:  Shortness of breath -I suspect this is related to sedentary state -he had not been very active and after being dx with HTN he started walking 2-6 miles a day and noticed that he would have DOE but this has improved with time -I will get a coronary CTA to define coronary anatome -check 2D echo to assess LVF  HTN -BP controlled on exam  -continue prescription drug management with Losartan '25mg'$  daily with PRN refills  Time Spent: 20 minutes total time of encounter, including 15 minutes spent in face-to-face patient care on the date of this encounter. This time includes coordination of care and counseling regarding above mentioned problem list. Remainder of non-face-to-face time involved reviewing chart documents/testing relevant to the  patient encounter and documentation in the medical record. I have independently reviewed documentation from referring provider  Medication Adjustments/Labs and Tests Ordered: Current medicines are reviewed at length with the patient today.  Concerns regarding medicines are outlined above.  Medication changes, Labs and Tests ordered today are listed in the Patient Instructions below.  There are no Patient Instructions on file  for this visit.   Signed, Fransico Him, MD  06/02/2022 1:45 PM    [facility] [place], [place], [place]  [address] Phone: [phone]; Fax: [phone]

## 2022-06-02 NOTE — Addendum Note (Signed)
Addended by: Bernestine Amass on: 06/02/2022 01:52 PM   Modules accepted: Orders

## 2022-06-02 NOTE — Patient Instructions (Addendum)
Medication Instructions:  Your physician recommends that you continue on your current medications as directed. Please refer to the Current Medication list given to you today.  *If you need a refill on your cardiac medications before your next appointment, please call your pharmacy*  Lab Work: TODAY: BMET If you have labs (blood work) drawn today and your tests are completely normal, you will receive your results only by: [place] (if you have MyChart) OR A paper copy in the mail If you have any lab test that is abnormal or we need to change your treatment, we will call you to review the results.   Testing/Procedures: Your physician has requested that you have an echocardiogram. Echocardiography is a painless test that uses sound waves to create images of your heart. It provides your doctor with information about the size and shape of your heart and how well your heart's chambers and valves are working. This procedure takes approximately one hour. There are no restrictions for this procedure. Please do NOT wear cologne, perfume, aftershave, or lotions (deodorant is allowed). Please arrive 15 minutes prior to your appointment time.   Your provider has requested that you have a coronary CTA. Please see below for further instructions.   Follow-Up: At [facility], you and your health needs are our priority.  As part of our continuing mission to provide you with exceptional heart care, we have created designated Provider Care Teams.  These Care Teams include your primary Cardiologist (physician) and Advanced Practice Providers (APPs -  Physician Assistants and Nurse Practitioners) who all work together to provide you with the care you need, when you need it.  Your next appointment:   As needed based on results of testing with Dr. Radford Pax  Other Instructions   Your cardiac CT will be scheduled at one of the below locations:   [facility] [address] [place], [place] [address] [phone]  [place] [address] [place], [place] [address] [phone]  [facility] [place], [place] [address] [phone]  If scheduled at [facility], please arrive at the [facility] and Children's Entrance (Entrance C2) of [facility] 30 minutes prior to test start time. You can use the FREE valet parking offered at entrance C (encouraged to control the heart rate for the test)  Proceed to the [facility] Radiology Department (first floor) to check-in and test prep.  All radiology patients and guests should use entrance C2 at [facility], accessed from [facility], even though the hospital's physical address listed is [address]    If scheduled at [facility], please arrive 15 mins early for check-in and test prep.   Please follow these instructions carefully (unless otherwise directed):  Hold all erectile dysfunction medications at least 3 days (72 hrs) prior to test. (Ie viagra, cialis, sildenafil, tadalafil, etc) We will administer nitroglycerin during this exam.   On the Night Before the Test: Be sure to Drink plenty of water. Do not consume any caffeinated/decaffeinated beverages or chocolate 12 hours prior to your test. Do not take any antihistamines 12 hours prior to your test.  On the Day of the Test: Drink plenty of water until 1 hour prior to the test. Do not eat any food 1 hour prior to test. You may take your regular medications prior to the  test.  Take metoprolol (Lopressor) and ivabridine two hours prior to test. Do not take losartan on the day of your test      After the Test: Drink plenty of water. After receiving IV contrast, you may experience a mild flushed feeling. This is normal. On occasion, you may  experience a mild rash up to 24 hours after the test. This is not dangerous. If this occurs, you can take Benadryl 25 mg and increase your fluid intake. If you experience trouble breathing, this can be serious. If it is severe call 911 IMMEDIATELY. If it is mild, please call our office. If you take any of these medications: Glipizide/Metformin, Avandament, Glucavance, please do not take 48 hours after completing test unless otherwise instructed.  We will call to schedule your test 2-4 weeks out understanding that some insurance companies will need an authorization prior to the service being performed.   For non-scheduling related questions, please contact the cardiac imaging nurse navigator should you have any questions/concerns: Marchia Bond, Cardiac Imaging Nurse Navigator Gordy Clement, Cardiac Imaging Nurse Navigator [facility] Direct Office Dial: [phone]   For scheduling needs, including cancellations and rescheduling, please call [place], [phone].

## 2022-06-03 LAB — BASIC METABOLIC PANEL
BUN/Creatinine Ratio: 14 (ref 10–24)
BUN: 13 mg/dL (ref 8–27)
CO2: 22 mmol/L (ref 20–29)
Calcium: 9 mg/dL (ref 8.6–10.2)
Chloride: 103 mmol/L (ref 96–106)
Creatinine, Ser: 0.94 mg/dL (ref 0.76–1.27)
Glucose: 84 mg/dL (ref 70–99)
Potassium: 4.1 mmol/L (ref 3.5–5.2)
Sodium: 139 mmol/L (ref 134–144)
eGFR: 87 mL/min/{1.73_m2} (ref >59)

## 2022-06-09 ENCOUNTER — Telehealth (HOSPITAL_COMMUNITY): Payer: Self-pay | Admitting: *Deleted

## 2022-06-09 NOTE — Telephone Encounter (Signed)
Calling to rescheduled patient's cardiac CT scan due to lack of insurance authorization. New appointment made for Wednesday, Feb 14 at 11am.  Patient verbalized understanding of new appt.  Gordy Clement RN Navigator Cardiac Imaging [facility] [phone] Office [phone] Cell

## 2022-06-10 ENCOUNTER — Ambulatory Visit (HOSPITAL_COMMUNITY): Admission: RE | Admit: 2022-06-10 | Payer: Medicare HMO | Source: Ambulatory Visit

## 2022-06-15 ENCOUNTER — Telehealth (HOSPITAL_COMMUNITY): Payer: Self-pay | Admitting: Emergency Medicine

## 2022-06-15 NOTE — Telephone Encounter (Signed)
Reaching out to patient to offer assistance regarding upcoming cardiac imaging study; pt verbalizes understanding of appt date/time, parking situation and where to check in, pre-test NPO status and medications ordered, and verified current allergies; name and call back number provided for further questions should they arise Marchia Bond RN Navigator Cardiac Imaging [facility] [phone] office [phone] cell  Arrival [address] entrance Denies iv issues 57m metoprolol + 574mivabradine Aware contrast/nitro

## 2022-06-16 DIAGNOSIS — L718 Other rosacea: Secondary | ICD-10-CM | POA: Diagnosis not present

## 2022-06-16 DIAGNOSIS — L570 Actinic keratosis: Secondary | ICD-10-CM | POA: Diagnosis not present

## 2022-06-16 DIAGNOSIS — Z08 Encounter for follow-up examination after completed treatment for malignant neoplasm: Secondary | ICD-10-CM | POA: Diagnosis not present

## 2022-06-16 DIAGNOSIS — Z85828 Personal history of other malignant neoplasm of skin: Secondary | ICD-10-CM | POA: Diagnosis not present

## 2022-06-16 DIAGNOSIS — X32XXXD Exposure to sunlight, subsequent encounter: Secondary | ICD-10-CM | POA: Diagnosis not present

## 2022-06-17 ENCOUNTER — Ambulatory Visit (HOSPITAL_BASED_OUTPATIENT_CLINIC_OR_DEPARTMENT_OTHER)
Admission: RE | Admit: 2022-06-17 | Discharge: 2022-06-17 | Disposition: A | Discharge status: Home / Self Care | Payer: Medicare HMO | Source: Ambulatory Visit | Attending: Cardiovascular Disease | Admitting: Cardiovascular Disease

## 2022-06-17 ENCOUNTER — Ambulatory Visit (HOSPITAL_COMMUNITY)
Admission: RE | Admit: 2022-06-17 | Discharge: 2022-06-17 | Disposition: A | Discharge status: Home / Self Care | Payer: Medicare HMO | Source: Ambulatory Visit | Attending: Cardiology | Admitting: Cardiology

## 2022-06-17 ENCOUNTER — Other Ambulatory Visit: Payer: Self-pay | Admitting: Cardiovascular Disease

## 2022-06-17 DIAGNOSIS — R931 Abnormal findings on diagnostic imaging of heart and coronary circulation: Secondary | ICD-10-CM | POA: Diagnosis not present

## 2022-06-17 DIAGNOSIS — R0602 Shortness of breath: Secondary | ICD-10-CM | POA: Insufficient documentation

## 2022-06-17 DIAGNOSIS — M47814 Spondylosis without myelopathy or radiculopathy, thoracic region: Secondary | ICD-10-CM | POA: Insufficient documentation

## 2022-06-17 DIAGNOSIS — I2510 Atherosclerotic heart disease of native coronary artery without angina pectoris: Secondary | ICD-10-CM | POA: Insufficient documentation

## 2022-06-17 DIAGNOSIS — I10 Essential (primary) hypertension: Secondary | ICD-10-CM | POA: Diagnosis present

## 2022-06-17 DIAGNOSIS — R0989 Other specified symptoms and signs involving the circulatory and respiratory systems: Secondary | ICD-10-CM | POA: Insufficient documentation

## 2022-06-17 MED ORDER — NITROGLYCERIN 0.4 MG SL SUBL
0.8000 mg | SUBLINGUAL_TABLET | Freq: Once | SUBLINGUAL | Status: AC
  Administered 2022-06-17: 0.8 mg via SUBLINGUAL

## 2022-06-17 MED ORDER — IOHEXOL 350 MG/ML SOLN
100.0000 mL | Freq: Once | INTRAVENOUS | Status: AC | PRN
  Administered 2022-06-17: 100 mL via INTRAVENOUS

## 2022-06-17 MED ORDER — NITROGLYCERIN 0.4 MG SL SUBL
SUBLINGUAL_TABLET | SUBLINGUAL | Status: AC
  Filled 2022-06-17: qty 2

## 2022-06-18 ENCOUNTER — Encounter: Payer: Self-pay | Admitting: Cardiology

## 2022-06-18 DIAGNOSIS — I2510 Atherosclerotic heart disease of native coronary artery without angina pectoris: Secondary | ICD-10-CM | POA: Insufficient documentation

## 2022-06-23 ENCOUNTER — Ambulatory Visit (HOSPITAL_COMMUNITY): Payer: Medicare HMO | Attending: Cardiology

## 2022-06-23 DIAGNOSIS — R0602 Shortness of breath: Secondary | ICD-10-CM | POA: Insufficient documentation

## 2022-06-23 DIAGNOSIS — I10 Essential (primary) hypertension: Secondary | ICD-10-CM | POA: Diagnosis not present

## 2022-06-23 LAB — ECHOCARDIOGRAM COMPLETE
Area-P 1/2: 4.49 cm2
S' Lateral: 2.8 cm

## 2022-06-25 ENCOUNTER — Telehealth: Payer: Self-pay | Admitting: *Deleted

## 2022-06-25 DIAGNOSIS — R931 Abnormal findings on diagnostic imaging of heart and coronary circulation: Secondary | ICD-10-CM

## 2022-06-25 MED ORDER — ASPIRIN 81 MG PO TBEC: 81.0000 mg | DELAYED_RELEASE_TABLET | Freq: Every day | ORAL | 3 refills | Status: AC

## 2022-06-25 NOTE — Telephone Encounter (Signed)
-----   Message from Bernestine Amass, RN sent at 06/23/2022  9:29 AM EST ----- Left message for patient to call back.

## 2022-06-25 NOTE — Telephone Encounter (Signed)
06/18/2022  9:16 AM EST     Coronary CTA showed a coronary calcium score of 613 which is 75th percentile for age, gender and race. <25% proximal and distal RCA, 25 to 49% mid RCA, 25 to 49% ostial OM1, <25% PDA and LCx, <25% LM and ostial LAD, 25-49% mid LAD/D1/D2 c/w nonobstructive disease.  These have patient come in for fasting lipid panel and ALT.  Start aspirin 81 mg daily.  Up with me in 1 year   Turner, Eber Hong, MD  Joni Reining, RN Normal FFR with no flow-limiting lesion    Noncardiac portion of coronary CTA showed degenerative changes in thoracic spine   Noncardiac portion of chest CT also showed some prominent pulmonary vasculature.  Please have him come in for a BNP  -_________________________________________________________________________  Patient made aware of results and recommendations.  He is coming for lab work fasting next Tuesday.  Appointment made and all orders placed.  Route to PCP per patient request.

## 2022-06-26 ENCOUNTER — Telehealth: Payer: Self-pay | Admitting: Cardiology

## 2022-06-26 ENCOUNTER — Telehealth: Payer: Self-pay

## 2022-06-26 NOTE — Telephone Encounter (Signed)
Reviewed echo results with patient ,patient verbalizes understanding that 2D echo showed normal heart function EF 60 to 65% with trivial leakiness of the mitral valve and mild calcification of the aortic valve normal for age.  Ascending aorta 40 mm but normal when indexed for BSA.

## 2022-06-26 NOTE — Telephone Encounter (Signed)
See notes dated 06/26/22 and 06/25/22.

## 2022-06-26 NOTE — Telephone Encounter (Signed)
-----   Message from Bernestine Amass, RN sent at 06/23/2022 10:18 AM EST ----- Left message for patient to call back.

## 2022-06-26 NOTE — Telephone Encounter (Signed)
Pt is returning a call regarding results.

## 2022-06-26 NOTE — Telephone Encounter (Signed)
Patient returning call. He states he will not be home until after 2:00 pm.

## 2022-06-30 ENCOUNTER — Ambulatory Visit: Payer: Medicare HMO | Attending: Cardiology

## 2022-06-30 DIAGNOSIS — R931 Abnormal findings on diagnostic imaging of heart and coronary circulation: Secondary | ICD-10-CM

## 2022-06-30 DIAGNOSIS — D696 Thrombocytopenia, unspecified: Secondary | ICD-10-CM | POA: Diagnosis not present

## 2022-06-30 DIAGNOSIS — R69 Illness, unspecified: Secondary | ICD-10-CM | POA: Diagnosis not present

## 2022-06-30 DIAGNOSIS — I2510 Atherosclerotic heart disease of native coronary artery without angina pectoris: Secondary | ICD-10-CM | POA: Diagnosis not present

## 2022-06-30 DIAGNOSIS — E7800 Pure hypercholesterolemia, unspecified: Secondary | ICD-10-CM | POA: Diagnosis not present

## 2022-06-30 DIAGNOSIS — K219 Gastro-esophageal reflux disease without esophagitis: Secondary | ICD-10-CM | POA: Diagnosis not present

## 2022-06-30 DIAGNOSIS — I10 Essential (primary) hypertension: Secondary | ICD-10-CM | POA: Diagnosis not present

## 2022-06-30 DIAGNOSIS — E785 Hyperlipidemia, unspecified: Secondary | ICD-10-CM | POA: Diagnosis not present

## 2022-07-01 LAB — ALT: ALT: 16 IU/L (ref 0–44)

## 2022-07-01 LAB — LIPID PANEL
Chol/HDL Ratio: 5.1 ratio — ABNORMAL HIGH (ref 0.0–5.0)
Cholesterol, Total: 177 mg/dL (ref 100–199)
HDL: 35 mg/dL — ABNORMAL LOW (ref >39)
LDL Chol Calc (NIH): 112 mg/dL — ABNORMAL HIGH (ref 0–99)
Triglycerides: 168 mg/dL — ABNORMAL HIGH (ref 0–149)
VLDL Cholesterol Cal: 30 mg/dL (ref 5–40)

## 2022-07-01 LAB — PRO B NATRIURETIC PEPTIDE: NT-Pro BNP: 107 pg/mL (ref 0–376)

## 2022-07-03 ENCOUNTER — Telehealth: Payer: Self-pay | Admitting: Cardiology

## 2022-07-03 DIAGNOSIS — E785 Hyperlipidemia, unspecified: Secondary | ICD-10-CM

## 2022-07-03 NOTE — Telephone Encounter (Signed)
Returned call to patient and discussed lab results.  Per Dr. Johney Frame: BNP is normal.        His cholesterol is elevated with goal LDL<70. Can we start crestor '20mg'$  daily and repeat lipids with ALT in 8 weeks for monitoring?    Patient states his PCP Dr. Lysle Rubens has already prescribed rosuvastatin '20mg'$  QD (started 06/25/22).  Medication list updated.  Ordered Lipids, ALT and scheduled lab appt for 08/31/22.  Patient verbalized understanding of the above and expressed appreciation for call.

## 2022-07-03 NOTE — Telephone Encounter (Signed)
Pt returning call for lab results  

## 2022-07-06 ENCOUNTER — Telehealth: Payer: Self-pay | Admitting: *Deleted

## 2022-07-06 MED ORDER — EZETIMIBE 10 MG PO TABS: 10.0000 mg | ORAL_TABLET | Freq: Every day | ORAL | 1 refills | Status: DC

## 2022-07-06 NOTE — Telephone Encounter (Signed)
-----   Message from Foster K sent at 07/06/2022  8:40 AM EST -----  ----- Message ----- From: Freada Bergeron, MD Sent: 07/04/2022   7:05 AM EST To: [facility] Triage  His cholesterol is above goal. Can we add zetia '10mg'$  daily and repeat lipids in 8 weeks? Goal LDL<70

## 2022-07-06 NOTE — Telephone Encounter (Signed)
The patient has been notified of the result and verbalized understanding.  All questions (if any) were answered.  Pt aware to add zetia 10 mg po daily to his regimen and we will keep his repeat lipids that are already scheduled for about 8 weeks out on 08/31/22.  Pt aware to start taking zetia 10 mg po daily.  He is aware that this is in addition to his crestor he takes.   He will keep his lab appt as previously scheduled to check his lipids/ALT, for 08/31/22.  He is aware to come fasting to this lab appt.   Confirmed the pharmacy of choice with the pt.   Pt verbalized understanding and agrees with this plan.

## 2022-07-13 ENCOUNTER — Telehealth: Payer: Self-pay | Admitting: Cardiology

## 2022-07-13 NOTE — Telephone Encounter (Signed)
Difficult to assess if joint pain is from Crestor, Zetia, or other causes. Can hold Zetia for a few days to see if pain goes away.  There is research that suggests Zetia '5mg'$  is as effective as '10mg'$ . However do not recommend switching dose until he has had a follow up lipid panel.

## 2022-07-13 NOTE — Telephone Encounter (Signed)
Spoke with the patient who states that since he started taking zetia he has been having pain and stiffness in his wrists, fingers, knees, and ankles. Patient was also recently started on Crestor 20 mg daily. Reports that tylenol helps with the pain but he is still experiencing stiffness. Patient also states that he read a study that zetia 5 mg daily was found to be just as effective as 10 mg daily and would like to know if he could reduce the dose. I advised that I have not heard of this study. Will check with PharmD and Dr. Johney Frame for further recommendations.

## 2022-07-13 NOTE — Telephone Encounter (Signed)
Spoke with the patient and advised him to hold zetia and let us know if symptoms resolve.

## 2022-07-13 NOTE — Telephone Encounter (Signed)
Pt c/o medication issue:  1. Name of Medication: ezetimibe (ZETIA) 10 MG tablet   2. How are you currently taking this medication (dosage and times per day)?    3. Are you having a reaction (difficulty breathing--STAT)? no  4. What is your medication issue? Patient is having joint pain in wrist, ankles, ane hand. Swollen ankles. Please advise

## 2022-07-27 NOTE — Telephone Encounter (Signed)
Called and spoke to patient to check on whether joint pain had improved off zetia. Patient states he did not stop his zetia, but his joint pain is "75%" better. He states he would like to stay on zetia and crestor as currently prescribed and have his lipids checked as planned on 08/31/22. He states he has been working on his diet and exercising and he wants to see if the medications and lifestyle changes have been effective in lowering his cholesterol. If they are effective, he'd like to consider lowering his zetia dose at that time to see if he can have effective lowering of his cholesterol with more tolerable side effects.

## 2022-08-31 ENCOUNTER — Ambulatory Visit: Payer: Medicare HMO | Attending: Cardiology

## 2022-08-31 DIAGNOSIS — E785 Hyperlipidemia, unspecified: Secondary | ICD-10-CM

## 2022-09-01 ENCOUNTER — Telehealth: Payer: Self-pay | Admitting: Cardiology

## 2022-09-01 LAB — LIPID PANEL
Chol/HDL Ratio: 2.4 ratio (ref 0.0–5.0)
Cholesterol, Total: 88 mg/dL — ABNORMAL LOW (ref 100–199)
HDL: 36 mg/dL — ABNORMAL LOW (ref >39)
LDL Chol Calc (NIH): 35 mg/dL (ref 0–99)
Triglycerides: 87 mg/dL (ref 0–149)
VLDL Cholesterol Cal: 17 mg/dL (ref 5–40)

## 2022-09-01 LAB — ALT: ALT: 22 IU/L (ref 0–44)

## 2022-09-01 NOTE — Telephone Encounter (Signed)
Patient stated he wants a call back to discuss the cholesterol numbers from his recent lab test.

## 2022-09-01 NOTE — Telephone Encounter (Signed)
Spoke with the patient and reviewed his recent lab results.

## 2022-09-03 NOTE — Telephone Encounter (Signed)
Called patient to follow up on previous request to repeat labs and then discuss lowering zetia dose. No answer, left message with no identifiers asking recipient to call office.

## 2022-09-23 NOTE — Telephone Encounter (Signed)
Called patient to follow up on previous request to repeat labs and then discuss lowering zetia dose. No answer, left message with no identifiers asking recipient to call office. Letter sent.

## 2022-09-30 ENCOUNTER — Telehealth: Payer: Self-pay

## 2022-09-30 DIAGNOSIS — E785 Hyperlipidemia, unspecified: Secondary | ICD-10-CM

## 2022-09-30 DIAGNOSIS — Z79899 Other long term (current) drug therapy: Secondary | ICD-10-CM

## 2022-09-30 MED ORDER — RAMIPRIL 5 MG PO CAPS: 5.0000 mg | ORAL_CAPSULE | Freq: Every day | ORAL | 3 refills | Status: DC

## 2022-09-30 NOTE — Telephone Encounter (Signed)
Patient calling in to discuss his recent labs. He states his primary MD discontinued his losartan and started him in ramipril instead. Med list updated. He also states that he is wondering if his zetia or statin dose can be decreased given how low his LDL was on his recent labs completed 08/31/22. Forwarded to Dr. Louanne Skye.

## 2022-09-30 NOTE — Telephone Encounter (Signed)
Called to discuss Dr. Debby Bud recommendations, no answer. Left message w/ no identifiers asking patient to call our office.

## 2022-10-08 ENCOUNTER — Telehealth: Payer: Self-pay | Admitting: *Deleted

## 2022-10-08 DIAGNOSIS — Z8719 Personal history of other diseases of the digestive system: Secondary | ICD-10-CM | POA: Diagnosis not present

## 2022-10-08 DIAGNOSIS — R0602 Shortness of breath: Secondary | ICD-10-CM | POA: Diagnosis not present

## 2022-10-08 DIAGNOSIS — K219 Gastro-esophageal reflux disease without esophagitis: Secondary | ICD-10-CM | POA: Diagnosis not present

## 2022-10-08 DIAGNOSIS — Z86010 Personal history of colonic polyps: Secondary | ICD-10-CM | POA: Diagnosis not present

## 2022-10-08 NOTE — Telephone Encounter (Signed)
   Pre-operative Risk Assessment    Patient Name: Dustin Berger  DOB: [birth year] MRN: [id number]      Request for Surgical Clearance    Procedure:   COLONOSCOPY/ENDOSCOPY  Date of Surgery:  Clearance 11/18/22                                 Surgeon:  DR. Levora Angel Surgeon's Group or Practice Name:  [facility] Phone number:  [phone] Fax number:  [phone]   Type of Clearance Requested:   - Medical ; NO MEDICATIONS LISTED AS NEEDING TO BE HELD   Type of Anesthesia:   PROPOFOL   Additional requests/questions:    Elpidio Anis   10/08/2022, 11:12 AM

## 2022-10-08 NOTE — Telephone Encounter (Signed)
Pt is scheduled for 6/19 at 8:50am with Robin Searing, NP for preop clearance.

## 2022-10-08 NOTE — Telephone Encounter (Signed)
   Name: TAIGE DESANTIS  DOB: [birth year]  MRN: [id number]  Primary Cardiologist: Armanda Magic, MD  Chart reviewed as part of pre-operative protocol coverage. Because of Makya Lingren Needles's past medical history and time since last visit, he will require a follow-up in-office visit in order to better assess preoperative cardiovascular risk.  Pre-op covering staff: - Please schedule appointment and call patient to inform them. If patient already had an upcoming appointment within acceptable timeframe, please add "pre-op clearance" to the appointment notes so provider is aware. - Please contact requesting surgeon's office via preferred method (i.e, phone, fax) to inform them of need for appointment prior to surgery.  No medications are indicated as needing held  Sharlene Dory, [place]  10/08/2022, 1:47 PM

## 2022-10-20 NOTE — Progress Notes (Unsigned)
Office Visit    Patient Name: Dustin Berger Date of Encounter: 10/20/2022  Primary Care Provider:  Georgann Housekeeper, MD Primary Cardiologist:  Armanda Magic, MD Primary Electrophysiologist: None   Past Medical History    Past Medical History:  Diagnosis Date   Anxiety    BPH (benign prostatic hyperplasia)    BPV (benign positional vertigo)    CAD (coronary artery disease), native coronary artery    Coronary CTA showed a coronary calcium score of 613 which is 75th percentile for age, gender and race. <25% proximal and distal RCA, 25 to 49% mid RCA, 25 to 49% ostial OM1, <25% PDA and LCx, <25% LM and ostial LAD, 25-49% mid LAD/D1/D2  by coronary CTA 2/24   Diverticulosis    Duodenal ulcer    First degree heart block    GERD (gastroesophageal reflux disease)    Glaucoma    History of adenomatous polyp of colon    Hypercholesteremia    Osteoarthritis    Other chronic pain    Sinus bradycardia    Thrombocytopenia (HCC)    Past Surgical History:  Procedure Laterality Date   APPENDECTOMY     APPENDECTOMY  2010   BACK SURGERY     LOWER BACK SURGERY  1997   MASS EXCISION  12/11/2011   Procedure: MINOR EXCISION OF MASS;  Surgeon: Wyn Forster., MD;  Location: [facility];  Service: Orthopedics;  Laterality: Right;   Excision Mucoid Cyst Right Thumb; Interphalangeal Joint Debridement    Allergies  No Known Allergies   History of Present Illness    Dustin Berger  is a 72 year old male with a PMH of HTN, HLD, GERD, anxiety, thrombocytopenia who presents today for preoperative clearance.  Mr. Zuk was seen initially by Dr. Mayford Knife on 06/02/2022 for complaint of shortness of breath.  He reported onset of symptoms with beginning losartan and HCTZ with eventual discontinuation of HCTZ.  Patient had coronary CTA completed with calcium score of 613 with diffusely small and distal LAD measuring 25-49%, mid LAD with nonobstructive disease on cardiac portion  of test showing prominent pulmonary vasculature.  Patient had BMP completed that was normal and he was started on Crestor 20 mg daily.  2D echo was also completed showing EF of 60 to 65% with trivial MVR ascending aorta 40 mm.  Since last being seen in the office patient reports***.  Patient denies chest pain, palpitations, dyspnea, PND, orthopnea, nausea, vomiting, dizziness, syncope, edema, weight gain, or early satiety.     ***Notes: -Preop clearance for colonoscopy Home Medications    Current Outpatient Medications  Medication Sig Dispense Refill   acetaminophen (TYLENOL) 325 MG tablet Take 650 mg by mouth every 6 (six) hours as needed.     aspirin EC 81 MG tablet Take 1 tablet (81 mg total) by mouth daily. Swallow whole. 90 tablet 3   clonazePAM (KLONOPIN) 0.5 MG tablet Take 0.5 mg by mouth 2 (two) times daily as needed for anxiety.     ezetimibe (ZETIA) 10 MG tablet Take 1 tablet (10 mg total) by mouth daily. 90 tablet 1   metoprolol tartrate (LOPRESSOR) 50 MG tablet Take 1 tablet (50 mg total) by mouth once for 1 dose. Take 1 tablet two hours prior to CT scan. 1 tablet 0   pantoprazole (PROTONIX) 40 MG tablet Take 40 mg by mouth daily.     ramipril (ALTACE) 5 MG capsule Take 1 capsule (5 mg total) by  mouth daily. 90 capsule 3   rosuvastatin (CRESTOR) 20 MG tablet Take 20 mg by mouth daily.     No current facility-administered medications for this visit.     Review of Systems  Please see the history of present illness.    (+)*** (+)***  All other systems reviewed and are otherwise negative except as noted above.  Physical Exam    Wt Readings from Last 3 Encounters:  06/02/22 207 lb 9.6 oz (94.2 kg)  04/10/22 210 lb (95.3 kg)  04/08/22 210 lb (95.3 kg)   ZO:XWRUE were no vitals filed for this visit.,There is no height or weight on file to calculate BMI.  Constitutional:      Appearance: Healthy appearance. Not in distress.  Neck:     Vascular: JVD normal.  Pulmonary:      Effort: Pulmonary effort is normal.     Breath sounds: No wheezing. No rales. Diminished in the bases Cardiovascular:     Normal rate. Regular rhythm. Normal S1. Normal S2.      Murmurs: There is no murmur.  Edema:    Peripheral edema absent.  Abdominal:     Palpations: Abdomen is soft non tender. There is no hepatomegaly.  Skin:    General: Skin is warm and dry.  Neurological:     General: No focal deficit present.     Mental Status: Alert and oriented to person, place and time.     Cranial Nerves: Cranial nerves are intact.  EKG/LABS/ Recent Cardiac Studies    ECG personally reviewed by me today - ***  Cardiac Studies & Procedures     STRESS TESTS  MYOCARDIAL PERFUSION IMAGING 06/18/2015  Narrative  Nuclear stress EF: 54%.  The study is normal.  This is a low risk study.  The left ventricular ejection fraction is mildly decreased (45-54%).   ECHOCARDIOGRAM  ECHOCARDIOGRAM COMPLETE 06/23/2022  Narrative ECHOCARDIOGRAM REPORT    Patient Name:   Dustin Berger Date of Exam: 06/23/2022 Medical Rec #:  [id number]          Height:       71.0 in Accession #:    [id number]         Weight:       207.6 lb Date of Birth:  1950-07-30          BSA:          2.142 m Patient Age:    71 years           BP:           113/62 mmHg Patient Gender: M                  HR:           52 bpm. Exam Location:  [address]  Procedure: 2D Echo, Color Doppler, Cardiac Doppler, 3D Echo and Strain Analysis  Indications:    SOB R06.02  History:        Patient has no prior history of Echocardiogram examinations. CAD, Arrythmias:First Degree Heart Block; Risk Factors:Hypertension.  Sonographer:    Thurman Coyer RDCS Referring Phys: [phone] TRACI R TURNER  IMPRESSIONS   1. Left ventricular ejection fraction, by estimation, is 60 to 65%. The left ventricle has normal function. The left ventricle has no regional wall motion abnormalities. Left ventricular diastolic parameters  were normal. 2. Right ventricular systolic function is normal. The right ventricular size is normal. There is normal pulmonary artery systolic pressure. 3. The mitral valve  is normal in structure. Trivial mitral valve regurgitation. No evidence of mitral stenosis. 4. The aortic valve is tricuspid. There is mild calcification of the aortic valve. Aortic valve regurgitation is not visualized. Aortic valve sclerosis/calcification is present, without any evidence of aortic stenosis. 5. Aortic dilatation noted. There is mild dilatation of the ascending aorta, measuring 40 mm. 6. The inferior vena cava is normal in size with greater than 50% respiratory variability, suggesting right atrial pressure of 3 mmHg.  FINDINGS Left Ventricle: Left ventricular ejection fraction, by estimation, is 60 to 65%. The left ventricle has normal function. The left ventricle has no regional wall motion abnormalities. The left ventricular internal cavity size was normal in size. There is no left ventricular hypertrophy. Left ventricular diastolic parameters were normal.  Right Ventricle: The right ventricular size is normal. No increase in right ventricular wall thickness. Right ventricular systolic function is normal. There is normal pulmonary artery systolic pressure. The tricuspid regurgitant velocity is 2.08 m/s, and with an assumed right atrial pressure of 3 mmHg, the estimated right ventricular systolic pressure is 20.3 mmHg.  Left Atrium: Left atrial size was normal in size.  Right Atrium: Right atrial size was normal in size.  Pericardium: There is no evidence of pericardial effusion.  Mitral Valve: The mitral valve is normal in structure. Trivial mitral valve regurgitation. No evidence of mitral valve stenosis.  Tricuspid Valve: The tricuspid valve is normal in structure. Tricuspid valve regurgitation is trivial. No evidence of tricuspid stenosis.  Aortic Valve: The aortic valve is tricuspid. There is mild  calcification of the aortic valve. Aortic valve regurgitation is not visualized. Aortic valve sclerosis/calcification is present, without any evidence of aortic stenosis.  Pulmonic Valve: The pulmonic valve was normal in structure. Pulmonic valve regurgitation is trivial. No evidence of pulmonic stenosis.  Aorta: Aortic dilatation noted. There is mild dilatation of the ascending aorta, measuring 40 mm.  Venous: The inferior vena cava is normal in size with greater than 50% respiratory variability, suggesting right atrial pressure of 3 mmHg.  IAS/Shunts: No atrial level shunt detected by color flow Doppler.   LEFT VENTRICLE PLAX 2D LVIDd:         4.40 cm   Diastology LVIDs:         2.80 cm   LV e' medial:    7.40 cm/s LV PW:         1.00 cm   LV E/e' medial:  12.4 LV IVS:        1.00 cm   LV e' lateral:   10.90 cm/s LVOT diam:     2.10 cm   LV E/e' lateral: 8.4 LV SV:         77 LV SV Index:   36 LVOT Area:     3.46 cm  3D Volume EF: 3D EF:        64 % LV EDV:       120 ml LV ESV:       43 ml LV SV:        77 ml  RIGHT VENTRICLE RV Basal diam:  3.50 cm RV Mid diam:    2.70 cm RV S prime:     11.60 cm/s TAPSE (M-mode): 2.6 cm  LEFT ATRIUM             Index        RIGHT ATRIUM           Index LA diam:  3.50 cm 1.63 cm/m   RA Area:     20.50 cm LA Vol (A2C):   52.1 ml 24.30 ml/m  RA Volume:   55.70 ml  26.00 ml/m LA Vol (A4C):   30.5 ml 14.24 ml/m LA Biplane Vol: 39.0 ml 18.21 ml/m AORTIC VALVE LVOT Vmax:   95.20 cm/s LVOT Vmean:  61.600 cm/s LVOT VTI:    0.222 m  AORTA Ao Root diam: 3.70 cm Ao Asc diam:  4.00 cm  MITRAL VALVE               TRICUSPID VALVE MV Area (PHT): 4.49 cm    TR Peak grad:   17.3 mmHg MV Decel Time: 169 msec    TR Vmax:        208.00 cm/s MV E velocity: 92.00 cm/s MV A velocity: 70.60 cm/s  SHUNTS MV E/A ratio:  1.30        Systemic VTI:  0.22 m Systemic Diam: 2.10 cm  Arvilla Meres MD Electronically signed by Arvilla Meres MD Signature Date/Time: 06/23/2022/10:08:47 AM    Final     CT SCANS  CT CORONARY MORPH W/CTA COR W/SCORE 06/20/2022  Addendum 06/20/2022  2:51 PM ADDENDUM REPORT: 06/20/2022 14:48  EXAM: OVER-READ INTERPRETATION  CT CHEST  The following report is an over-read performed by radiologist Dr. Elnoria Howard [facility] on 06/20/2022. This over-read does not include interpretation of cardiac or coronary anatomy or pathology. The cardiovascular interpretation by the cardiologist is attached.  COMPARISON:  None.  FINDINGS: Prominent pulmonary vasculature. Minimal bilateral dependent atelectasis. Small amount of linear scarring at the left lung base. No lung nodules or pleural fluid. Thoracic spine degenerative changes, including changes of DISH.  IMPRESSION: 1. Pulmonary vascular congestion. 2. Thoracic spine degenerative changes, including changes of DISH.   Electronically Signed By: Beckie Salts M.D. On: 06/20/2022 14:48  Narrative CLINICAL DATA:  Chest pain  EXAM: Cardiac CTA  MEDICATIONS: Sub lingual nitro. 4mg  and lopressor 50mg  Ivabradine 5 mg  TECHNIQUE: The patient was scanned on a Siemens Force 192 slice scanner. Gantry rotation speed was 250 msecs. Collimation was .6 mm. A 120 kV prospective scan was triggered in the ascending thoracic aorta at 140 HU's Full mA was used between 35% and 75% of the R-R interval. Average HR during the scan was 48 bpm. The 3D data set was interpreted on a dedicated work station using MPR, MIP and VRT modes. A total of 80cc of contrast was used.  FINDINGS: Non-cardiac: See separate report from [facility]. No significant findings on limited lung and soft tissue windows.  Calcium Score: LM and 3 vessel calcium noted  LM 7.93  LAD 114  LCX 49.7  RCA 441  Total 613 which is 75 th percentile for age/sex  Coronary Arteries: Right dominant with no anomalies  LM: 1-24% calcified ostial  plaque  LAD: 1-24% calcified plaque ostium, 25-49% mixed plaque in mid vessel Distal vessel small and not well visualized with diffuse soft plaque  D1: 25-49% calcified plaque ostium  D2: 25-49% mixed plaque proximally  Circumflex: 1-24% soft plaque proximally  OM1: 25-49% calcified plaque ostium  OM2: Normal  RCA: 1-24% mixed plaque proximally 25-49% calcified plaque mid, 1-24% mixed plaque distally  PDA: 1-24% mixed plaque mid vessel  PLA: Normal  IMPRESSION: 1. Normal ascending thoracic aorta 3.7 cm  2.  Calcium score 613 which is 75 th percentile for age/sex  3. CAD RADS 2 with tightest lesions in OM/Diagonals Diffusely small  distal LAD study sent for [facility]  Charlton Haws  Electronically Signed: By: Charlton Haws M.D. On: 06/17/2022 12:22          Risk Assessment/Calculations:   {Does this patient have ATRIAL FIBRILLATION?:[phone]}        Lab Results  Component Value Date   WBC 6.5 04/10/2022   HGB 15.5 04/10/2022   HCT 46.7 04/10/2022   MCV 92.7 04/10/2022   PLT 161 04/10/2022   Lab Results  Component Value Date   CREATININE 0.94 06/02/2022   BUN 13 06/02/2022   NA 139 06/02/2022   K 4.1 06/02/2022   CL 103 06/02/2022   CO2 22 06/02/2022   Lab Results  Component Value Date   ALT 22 08/31/2022   AST 29 04/10/2022   ALKPHOS 63 04/10/2022   BILITOT 0.7 04/10/2022   Lab Results  Component Value Date   CHOL 88 (L) 08/31/2022   HDL 36 (L) 08/31/2022   LDLCALC 35 08/31/2022   TRIG 87 08/31/2022   CHOLHDL 2.4 08/31/2022    No results found for: "HGBA1C"   Assessment & Plan    1.  Preoperative clearance: -Patient's RCRI is 0.4%  2.  Coronary artery disease: -s/p coronary CTA completed 06/2022 showing nonobstructive CAD and calcium score of 613  3.  Essential hypertension: -Patient's blood pressure today is***  4.  Hyperlipidemia:      Disposition: Follow-up with Armanda Magic, MD or APP in *** months {Are you ordering a CV  Procedure (e.g. stress test, cath, DCCV, TEE, etc)?   Press F2        :[id number]}   Medication Adjustments/Labs and Tests Ordered: Current medicines are reviewed at length with the patient today.  Concerns regarding medicines are outlined above.   Signed, Napoleon Form, Leodis Rains, NP 10/20/2022, 8:06 PM [facility]

## 2022-10-21 ENCOUNTER — Encounter: Payer: Self-pay | Admitting: Nurse Practitioner

## 2022-10-21 ENCOUNTER — Ambulatory Visit: Payer: Medicare HMO | Attending: Nurse Practitioner | Admitting: Nurse Practitioner

## 2022-10-21 VITALS — BP 120/62 | HR 60 | Ht 71.0 in | Wt 196.2 lb

## 2022-10-21 DIAGNOSIS — Z01810 Encounter for preprocedural cardiovascular examination: Secondary | ICD-10-CM | POA: Diagnosis not present

## 2022-10-21 DIAGNOSIS — E785 Hyperlipidemia, unspecified: Secondary | ICD-10-CM | POA: Diagnosis not present

## 2022-10-21 DIAGNOSIS — I2510 Atherosclerotic heart disease of native coronary artery without angina pectoris: Secondary | ICD-10-CM

## 2022-10-21 DIAGNOSIS — I10 Essential (primary) hypertension: Secondary | ICD-10-CM

## 2022-10-21 NOTE — Patient Instructions (Signed)
Medication Instructions:  Your physician recommends that you continue on your current medications as directed. Please refer to the Current Medication list given to you today. *If you need a refill on your cardiac medications before your next appointment, please call your pharmacy* HOLD YOUR ASPIRIN 7 DAYS PRIOR TO PROCEDURE AND RESTART AFTER PROCEDURE   Lab Work: None ordered If you have labs (blood work) drawn today and your tests are completely normal, you will receive your results only by: MyChart Message (if you have MyChart) OR A paper copy in the mail If you have any lab test that is abnormal or we need to change your treatment, we will call you to review the results.   Testing/Procedures: None ordered   Follow-Up: At [facility], you and your health needs are our priority.  As part of our continuing mission to provide you with exceptional heart care, we have created designated Provider Care Teams.  These Care Teams include your primary Cardiologist (physician) and Advanced Practice Providers (APPs -  Physician Assistants and Nurse Practitioners) who all work together to provide you with the care you need, when you need it.  We recommend signing up for the patient portal called "MyChart".  Sign up information is provided on this After Visit Summary.  MyChart is used to connect with patients for Virtual Visits (Telemedicine).  Patients are able to view lab/test results, encounter notes, upcoming appointments, etc.  Non-urgent messages can be sent to your provider as well.   To learn more about what you can do with MyChart, go to [email or web address].    Your next appointment:   12 month(s)  Provider:   Armanda Magic, MD     Other Instructions  YOU ARE CLEARED FOR YOUR PROCEDURE

## 2022-10-30 NOTE — Addendum Note (Signed)
Addended by: Lars Mage on: 10/30/2022 10:57 AM   Modules accepted: Orders

## 2022-10-30 NOTE — Telephone Encounter (Signed)
Returned call to patient and left message informing him that medication he had requested from Dr Mayford Knife she wouldn't prescribe and wanted him to see PCP.

## 2022-10-30 NOTE — Telephone Encounter (Signed)
Returned call to patient to discuss below recommendations of Dr Izora Ribas: Izora Ribas, Rondel Jumbo, MD  Luellen Pucker, RN Cc: Dustin Sprague, MD Caller: Unspecified (1 month ago) And LDL this low will not cause him any issues; given his calcium score continuing these medications is reasonable. If he wishes to stop; the zetia would be reasonable to stop and f/u labs in 2-3 months  He states he would like to stop zetia and hasn't yet stopped anything. He is agreeable to repeat labs in 3 months. Labs entered and scheduled for 01/29/23. He also mentions that he has asked before and didn't get a response to his concern that he is now experiencing issues with erectile dysfunction and would like to know if this could be caused by one of his medications,as well as, if MD will prescribe something to help with this. Routing to [employer] for advisement.

## 2022-11-18 DIAGNOSIS — D122 Benign neoplasm of ascending colon: Secondary | ICD-10-CM | POA: Diagnosis not present

## 2022-11-18 DIAGNOSIS — Z86010 Personal history of colonic polyps: Secondary | ICD-10-CM | POA: Diagnosis not present

## 2022-11-18 DIAGNOSIS — Z09 Encounter for follow-up examination after completed treatment for conditions other than malignant neoplasm: Secondary | ICD-10-CM | POA: Diagnosis not present

## 2022-11-18 DIAGNOSIS — K2950 Unspecified chronic gastritis without bleeding: Secondary | ICD-10-CM | POA: Diagnosis not present

## 2022-11-18 DIAGNOSIS — K2930 Chronic superficial gastritis without bleeding: Secondary | ICD-10-CM | POA: Diagnosis not present

## 2022-11-18 DIAGNOSIS — D123 Benign neoplasm of transverse colon: Secondary | ICD-10-CM | POA: Diagnosis not present

## 2022-11-18 DIAGNOSIS — R12 Heartburn: Secondary | ICD-10-CM | POA: Diagnosis not present

## 2022-11-18 DIAGNOSIS — D124 Benign neoplasm of descending colon: Secondary | ICD-10-CM | POA: Diagnosis not present

## 2022-11-18 DIAGNOSIS — K3189 Other diseases of stomach and duodenum: Secondary | ICD-10-CM | POA: Diagnosis not present

## 2022-11-25 DIAGNOSIS — D122 Benign neoplasm of ascending colon: Secondary | ICD-10-CM | POA: Diagnosis not present

## 2022-11-25 DIAGNOSIS — K2930 Chronic superficial gastritis without bleeding: Secondary | ICD-10-CM | POA: Diagnosis not present

## 2022-11-25 DIAGNOSIS — D124 Benign neoplasm of descending colon: Secondary | ICD-10-CM | POA: Diagnosis not present

## 2022-11-25 DIAGNOSIS — D123 Benign neoplasm of transverse colon: Secondary | ICD-10-CM | POA: Diagnosis not present

## 2022-12-14 DIAGNOSIS — H04123 Dry eye syndrome of bilateral lacrimal glands: Secondary | ICD-10-CM | POA: Diagnosis not present

## 2022-12-14 DIAGNOSIS — H2513 Age-related nuclear cataract, bilateral: Secondary | ICD-10-CM | POA: Diagnosis not present

## 2023-01-05 DIAGNOSIS — F411 Generalized anxiety disorder: Secondary | ICD-10-CM | POA: Diagnosis not present

## 2023-01-05 DIAGNOSIS — Z0000 Encounter for general adult medical examination without abnormal findings: Secondary | ICD-10-CM | POA: Diagnosis not present

## 2023-01-05 DIAGNOSIS — N529 Male erectile dysfunction, unspecified: Secondary | ICD-10-CM | POA: Diagnosis not present

## 2023-01-05 DIAGNOSIS — Z8719 Personal history of other diseases of the digestive system: Secondary | ICD-10-CM | POA: Diagnosis not present

## 2023-01-05 DIAGNOSIS — D696 Thrombocytopenia, unspecified: Secondary | ICD-10-CM | POA: Diagnosis not present

## 2023-01-05 DIAGNOSIS — I10 Essential (primary) hypertension: Secondary | ICD-10-CM | POA: Diagnosis not present

## 2023-01-05 DIAGNOSIS — I2510 Atherosclerotic heart disease of native coronary artery without angina pectoris: Secondary | ICD-10-CM | POA: Diagnosis not present

## 2023-01-05 DIAGNOSIS — N400 Enlarged prostate without lower urinary tract symptoms: Secondary | ICD-10-CM | POA: Diagnosis not present

## 2023-01-05 DIAGNOSIS — E7800 Pure hypercholesterolemia, unspecified: Secondary | ICD-10-CM | POA: Diagnosis not present

## 2023-01-05 DIAGNOSIS — R059 Cough, unspecified: Secondary | ICD-10-CM | POA: Diagnosis not present

## 2023-01-05 DIAGNOSIS — Z1331 Encounter for screening for depression: Secondary | ICD-10-CM | POA: Diagnosis not present

## 2023-01-29 ENCOUNTER — Ambulatory Visit: Payer: Medicare HMO | Attending: Internal Medicine

## 2023-03-08 ENCOUNTER — Ambulatory Visit: Payer: Medicare HMO | Admitting: Internal Medicine

## 2023-03-08 ENCOUNTER — Encounter: Payer: Self-pay | Admitting: Internal Medicine

## 2023-03-08 ENCOUNTER — Other Ambulatory Visit: Payer: Self-pay

## 2023-03-08 VITALS — BP 130/72 | HR 52 | Temp 97.5°F | Resp 16 | Ht 71.0 in | Wt 202.8 lb

## 2023-03-08 DIAGNOSIS — K219 Gastro-esophageal reflux disease without esophagitis: Secondary | ICD-10-CM

## 2023-03-08 DIAGNOSIS — I10 Essential (primary) hypertension: Secondary | ICD-10-CM | POA: Diagnosis not present

## 2023-03-08 DIAGNOSIS — R053 Chronic cough: Secondary | ICD-10-CM

## 2023-03-08 NOTE — Progress Notes (Signed)
NEW PATIENT Date of Service/Encounter:  03/08/23 Referring provider: Georgann Housekeeper, MD Primary care provider: Georgann Housekeeper, MD  Subjective:  Dustin Berger is a 72 y.o. male with a PMHx of GERD, dyslipidemia, sinus bradycardia with first-degree AV block, hypertension, benign prostate hyperplasia, osteoarthritis, presenting today for evaluation of cough. History obtained from: chart review and patient.   Discussed the use of AI scribe software for clinical note transcription with the patient, who gave verbal consent to proceed.  History of Present Illness   The patient, with a history of hypertension and hypercholesterolemia, presents with a persistent cough that began around February. The cough was initially attributed to a side effect of a new antihypertensive medication, which was subsequently changed from losartan to olmesartan. The patient also reports a history of reflux, managed with daily pantoprazole. Despite these changes, the cough persists, often triggered by a scratchy throat sensation, and is sometimes accompanied by a sensation of mucus drainage. The cough is described as dry, with no significant sputum production. The patient has tried various interventions, including changing bedding and using cough drops, which provide temporary relief. The cough has been disruptive, causing discomfort and soreness, and was particularly noticeable during a recent two-week period of jury duty. The patient denies any history of asthma, medication allergies, food allergies, or eczema. The patient's immunizations are up to date.  He has not seen any other specialist for her ongoing cough.  He is not on any allergy medications at this time.     Chart Review:  Reviewed PCP notes from referral 01/29/23: Cough not getting better CT coronary: 06/17/22: "IMPRESSION: 1. Pulmonary vascular congestion. - Prominent pulmonary vasculature. Minimal bilateral dependent atelectasis. Small amount of linear  scarring at the left lung base. No lung nodules or pleural fluid 2. Thoracic spine degenerative changes, including changes of DISH."  Past Medical History: Past Medical History:  Diagnosis Date   Anxiety    BPH (benign prostatic hyperplasia)    BPV (benign positional vertigo)    CAD (coronary artery disease), native coronary artery    Coronary CTA showed a coronary calcium score of 613 which is 75th percentile for age, gender and race. <25% proximal and distal RCA, 25 to 49% mid RCA, 25 to 49% ostial OM1, <25% PDA and LCx, <25% LM and ostial LAD, 25-49% mid LAD/D1/D2  by coronary CTA 2/24   Diverticulosis    Duodenal ulcer    First degree heart block    GERD (gastroesophageal reflux disease)    Glaucoma    History of adenomatous polyp of colon    Hypercholesteremia    Osteoarthritis    Other chronic pain    Sinus bradycardia    Thrombocytopenia (HCC)    Medication List:  Current Outpatient Medications  Medication Sig Dispense Refill   acetaminophen (TYLENOL) 325 MG tablet Take 650 mg by mouth every 6 (six) hours as needed.     aspirin EC 81 MG tablet Take 1 tablet (81 mg total) by mouth daily. Swallow whole. 90 tablet 3   clonazePAM (KLONOPIN) 0.5 MG tablet Take 0.5 mg by mouth 2 (two) times daily as needed for anxiety.     meclizine (ANTIVERT) 25 MG tablet Take 25 mg by mouth 3 (three) times daily as needed for dizziness.     olmesartan (BENICAR) 20 MG tablet Take 20 mg by mouth daily.     pantoprazole (PROTONIX) 40 MG tablet Take 40 mg by mouth daily.     rosuvastatin (CRESTOR) 20 MG tablet  Take 20 mg by mouth daily.     No current facility-administered medications for this visit.   Known Allergies:  Allergies  Allergen Reactions   Losartan Potassium Cough   Past Surgical History: Past Surgical History:  Procedure Laterality Date   APPENDECTOMY     APPENDECTOMY  2010   BACK SURGERY     LOWER BACK SURGERY  1997   MASS EXCISION  12/11/2011   Procedure: MINOR EXCISION  OF MASS;  Surgeon: Wyn Forster., MD;  Location: [facility];  Service: Orthopedics;  Laterality: Right;   Excision Mucoid Cyst Right Thumb; Interphalangeal Joint Debridement   Family History: Family History  Problem Relation Age of Onset   Diabetes Mother    Cancer Sister    Social History: Tegh lives in a house built 30 years ago, no water damage, hardwood floors, gassing, central AC, 66-month-old pedal Toya dog indoors, no roaches, using dust mite covers on the bed but not pillows, no smoke exposure.  He is retired.  No HEPA filter in the home.  [place]   ROS:  All other systems negative except as noted per HPI.  Objective:  Blood pressure 130/72, pulse (!) 52, temperature (!) 97.5 F (36.4 C), temperature source Temporal, resp. rate 16, height 5\' 11"  (1.803 m), weight 202 lb 12.8 oz (92 kg), SpO2 97%. Body mass index is 28.28 kg/m. Physical Exam:  General Appearance:  Alert, cooperative, no distress, appears stated age  Head:  Normocephalic, without obvious abnormality, atraumatic  Eyes:  Conjunctiva clear, EOM's intact  Ears EACs normal bilaterally and normal TMs bilaterally  Nose: Nares normal, hypertrophic turbinates, normal mucosa, and no visible anterior polyps  Throat: Lips, tongue normal; teeth and gums normal, normal posterior oropharynx  Neck: Supple, symmetrical  Lungs:   clear to auscultation bilaterally, Respirations unlabored, no coughing  Heart:  regular rate and rhythm and no murmur, Appears well perfused  Extremities: No edema  Skin: Skin color, texture, turgor normal and no rashes or lesions on visualized portions of skin  Neurologic: No gross deficits   Diagnostics: Spirometry:  Tracings reviewed. His effort: Variable effort-results affected FVC: 3.66L  FEV1: 2.75L, 84% predicted  FEV1/FVC ratio: 0.75 Interpretation: Spirometry consistent with normal pattern.  Please see scanned spirometry results  for details.  Labs:  Lab Orders  No laboratory test(s) ordered today     Assessment and Plan  Assessment and Plan    Chronic Cough-suspected upper airway cough syndrome Persistent cough since February, possibly related to antihypertensive medication (initially losartan, now olmesartan). No clear triggers identified. Cough is dry, with occasional sensation of mucus in the throat. No asthma history. Lungs sound clear on examination. -Perform spirometry today to look at lower airway. Spirometry overall normal. No signs of obstruction. Do not suspect asthma. -Allergy testing on 03/22/2023 at 9 AM to look for allergic causes of drainage - do not take any antihistamines for 3 days prior to visit (benadryl, zyrtec, claritin, etc) -Recommend primary care physician consider adjusting antihypertensive medication (consider a non-ARB or ACE-I). -Start saline spray and Flonase 1 spray each nostril twice daily to manage potential nasal drainage. Can be purchased OTC> - if allergy testing negative, consider ENT referral  Hypertension Currently managed with olmesartan. History of medication changes due to possible side effects (cough). -Communicate with primary care physician about potential medication adjustment due to persistent cough.  Gastroesophageal Reflux Disease (GERD) Managed with pantoprazole 40mg  daily. No current symptoms of heartburn or  chest pain. -Continue pantoprazole 40 mg daily as prescribed.  Follow-up November 18th at 9 AM for allergy testing It was a pleasure meeting you in clinic today! Thank you for allowing me to participate in your care.  Tonny Bollman, MD [facility]      This note in its entirety was forwarded to the Provider who requested this consultation.  Other: none  Thank you for your kind referral. I appreciate the opportunity to take part in [place] care. Please do not hesitate to contact me with questions.  Sincerely,  Tonny Bollman,  MD [facility]

## 2023-03-08 NOTE — Patient Instructions (Addendum)
Chronic Cough-suspected upper airway cough syndrome Persistent cough since February, possibly related to antihypertensive medication (initially losartan, now olmesartan). No clear triggers identified. Cough is dry, with occasional sensation of mucus in the throat. No asthma history. Lungs sound clear on examination. -Perform spirometry today to look at lower airway. Spirometry overall normal. No signs of obstruction. Do not suspect asthma. -Allergy testing on 03/22/2023 at 9 AM to look for allergic causes of drainage - do not take any antihistamines for 3 days prior to visit (benadryl, zyrtec, claritin, etc) -Recommend primary care physician consider adjusting antihypertensive medication (consider a non-ARB or ACE-I). -Start saline spray and Flonase 1 spray each nostril twice daily to manage potential nasal drainage. Can be purchased OTC> - if allergy testing negative, consider ENT referral  Hypertension Currently managed with olmesartan. History of medication changes due to possible side effects (cough). -Communicate with primary care physician about potential medication adjustment due to persistent cough.  Gastroesophageal Reflux Disease (GERD) Managed with pantoprazole 40mg  daily. No current symptoms of heartburn or chest pain. -Continue pantoprazole 40 mg daily as prescribed.  Follow-up November 18th at 9 AM for allergy testing It was a pleasure meeting you in clinic today! Thank you for allowing me to participate in your care.

## 2023-03-22 ENCOUNTER — Ambulatory Visit: Payer: Medicare HMO | Admitting: Internal Medicine

## 2023-03-22 ENCOUNTER — Encounter: Payer: Self-pay | Admitting: Internal Medicine

## 2023-03-22 DIAGNOSIS — R058 Other specified cough: Secondary | ICD-10-CM

## 2023-03-22 DIAGNOSIS — J310 Chronic rhinitis: Secondary | ICD-10-CM

## 2023-03-22 NOTE — Patient Instructions (Addendum)
Chronic Cough-suspected upper airway cough syndrome Persistent cough since February, possibly related to antihypertensive medication (initially losartan, now olmesartan). No clear triggers identified. Cough is dry, with occasional sensation of mucus in the throat. No asthma history. Lungs sound clear on examination. -Spirometry normal on initial visit. -allergy testing today negative on skin and intradermal testing -Recommend primary care physician consider adjusting antihypertensive medication (consider a non-ARB or ACE-I). -Continue saline spray and Flonase 1 spray each nostril twice daily to manage potential nasal drainage. Can be purchased OTC> - Refer to ENT to look for other potential airway causes  Hypertension Currently managed with olmesartan. History of medication changes due to possible side effects (cough). -Communicate with primary care physician about potential medication adjustment due to persistent cough.  Gastroesophageal Reflux Disease (GERD) Managed with pantoprazole 40mg  daily. No current symptoms of heartburn or chest pain. -Continue pantoprazole 40 mg daily as prescribed.  Follow-up after seeing ENT It was a pleasure seeing you again in clinic today! Thank you for allowing me to participate in your care.

## 2023-03-22 NOTE — Progress Notes (Signed)
Date of Service/Encounter:  03/22/23  Allergy testing appointment   Initial visit on 03/08/23, seen for chronic cough and rhinitis.  Please see that note for additional details.  Today reports for allergy diagnostic testing:    DIAGNOSTICS:  Skin Testing: Environmental allergy panel. Adequate positive and negative controls. Results discussed with patient/family.  Airborne Adult Perc - 03/22/23 0855     Time Antigen Placed 0855    Allergen Manufacturer Waynette Buttery    Location Back    Number of Test 55    1. Control-Buffer 50% Glycerol Negative    2. Control-Histamine 3+    3. Bahia Negative    4. French Southern Territories Negative    5. Johnson Negative    6. Kentucky Blue Negative    7. Meadow Fescue Negative    8. Perennial Rye Negative    9. Timothy Negative    10. Ragweed Mix Negative    11. Cocklebur Negative    12. Plantain,  English Negative    13. Baccharis Negative    14. Dog Fennel Negative    15. Russian Thistle Negative    16. Lamb's Quarters Negative    17. Sheep Sorrell Negative    18. Rough Pigweed Negative    19. Marsh Elder, Rough Negative    20. Mugwort, Common Negative    21. Box, Elder Negative    22. Cedar, red Negative    23. Sweet Gum Negative    24. Pecan Pollen Negative    25. Pine Mix Negative    26. Walnut, Black Pollen Negative    27. Red Mulberry Negative    28. Ash Mix Negative    29. Birch Mix Negative    30. Beech American Negative    31. Cottonwood, Guinea[place] Negative    32. Hickory, White Negative    33. Maple Mix Negative    34. Oak, [place] Mix Negative    35. Sycamore Eastern Negative    36. Alternaria Alternata Negative    37. Cladosporium Herbarum Negative    38. Aspergillus Mix Negative    39. Penicillium Mix Negative    40. Bipolaris Sorokiniana (Helminthosporium) Negative    41. Drechslera Spicifera (Curvularia) Negative    42. Mucor Plumbeus Negative    43. Fusarium Moniliforme Negative    44. Aureobasidium Pullulans (pullulara)  Negative    45. Rhizopus Oryzae Negative    46. Botrytis Cinera Negative    47. Epicoccum Nigrum Negative    48. Phoma Betae Negative    49. Dust Mite Mix Negative    50. Cat Hair 10,000 BAU/ml Negative    51.  Dog Epithelia Negative    52. Mixed Feathers Negative    53. Horse Epithelia Negative    54. Cockroach, German Negative    55. Tobacco Leaf Negative             Intradermal - 03/22/23 0921     Time Antigen Placed 1610    Allergen Manufacturer Waynette Buttery    Location Back    Number of Test 16    Control Negative    Bahia Negative    French Southern Territories Negative    Johnson Negative    7 Grass Negative    Ragweed Mix Negative    Weed Mix Negative    Tree Mix Negative    Mold 1 Negative    Mold 2 Negative    Mold 3 Negative    Mold 4 Negative    Mite Mix Negative    Cat Negative  Dog Negative    Cockroach Negative             Allergy testing results were read and interpreted by myself, documented by clinical staff.  Patient provided with copy of allergy testing along with avoidance measures when indicated.   Tonny Bollman, MD  [facility]

## 2023-03-25 DIAGNOSIS — R202 Paresthesia of skin: Secondary | ICD-10-CM | POA: Diagnosis not present

## 2023-03-30 ENCOUNTER — Telehealth: Payer: Self-pay

## 2023-03-30 NOTE — Telephone Encounter (Signed)
-----   Message from Verlee Monte sent at 03/22/2023  9:54 AM EST ----- Hi-Can we send to ENT for upper airway cough syndrome with negative allergy testing?

## 2023-03-30 NOTE — Telephone Encounter (Signed)
Patients referral has been placed to [facility] Patient has been updated via MyChart.

## 2023-04-15 ENCOUNTER — Encounter (INDEPENDENT_AMBULATORY_CARE_PROVIDER_SITE_OTHER): Payer: Self-pay | Admitting: Otolaryngology

## 2023-04-15 ENCOUNTER — Ambulatory Visit (INDEPENDENT_AMBULATORY_CARE_PROVIDER_SITE_OTHER): Payer: Medicare HMO | Admitting: Otolaryngology

## 2023-04-15 VITALS — BP 151/81 | HR 55 | Ht 71.0 in | Wt 203.0 lb

## 2023-04-15 DIAGNOSIS — K219 Gastro-esophageal reflux disease without esophagitis: Secondary | ICD-10-CM

## 2023-04-15 DIAGNOSIS — J300 Vasomotor rhinitis: Secondary | ICD-10-CM

## 2023-04-15 DIAGNOSIS — R0981 Nasal congestion: Secondary | ICD-10-CM | POA: Diagnosis not present

## 2023-04-15 DIAGNOSIS — R053 Chronic cough: Secondary | ICD-10-CM

## 2023-04-15 DIAGNOSIS — R0982 Postnasal drip: Secondary | ICD-10-CM | POA: Diagnosis not present

## 2023-04-15 MED ORDER — FAMOTIDINE 20 MG PO TABS: 20.0000 mg | ORAL_TABLET | Freq: Every day | ORAL | 3 refills | Status: DC

## 2023-04-15 MED ORDER — BENZONATATE 100 MG PO CAPS: 100.0000 mg | ORAL_CAPSULE | Freq: Two times a day (BID) | ORAL | 1 refills | Status: AC | PRN

## 2023-04-15 NOTE — Progress Notes (Signed)
ENT CONSULT:  Reason for Consult: chronic cough    HPI: Discussed the use of AI scribe software for clinical note transcription with the patient, who gave verbal consent to proceed.  History of Present Illness   The patient is a 72 yoM, with a history of hypertension and rhinitis, was referred by their allergy provider for a chronic cough that began in April, 2024. The cough is intermittent and described as an itch in the middle of the throat. The patient has been using cough drops and Flonase to manage the symptoms, but with limited success. The patient denies any productive cough. The cough does not seem to follow a specific pattern throughout the day but may be triggered by changes in temperature or atmospheric conditions. Strong smells, such as perfume, have also been noted to induce coughing. The patient denies any difficulty breathing, voice changes, or swallowing difficulties. There is no history of smoking or lung disease.  The current antihypertensive medication is olmesartan. The patient also takes pantoprazole once daily.  The patient has a history of chronic rhinitis, with negative allergy skin testing. The patient has been using Flonase and saline as interventions. The patient also reports post-nasal nasal drainage worse on the left side.  The patient had a chest x-ray in 2014, but no recent imaging has been done. The patient has not tried any cough suppressants like Tessalon or benzonatate.       Records Reviewed:  Asthma and Allergy Office Note 03/08/23 Dustin Berger is a 72 y.o. male with a PMHx of GERD, dyslipidemia, sinus bradycardia with first-degree AV block, hypertension, benign prostate hyperplasia, osteoarthritis, presenting today for evaluation of cough. History obtained from: chart review and patient.   Discussed the use of AI scribe software for clinical note transcription with the patient, who gave verbal consent to proceed.   History of Present Illness   The  patient, with a history of hypertension and hypercholesterolemia, presents with a persistent cough that began around February. The cough was initially attributed to a side effect of a new antihypertensive medication, which was subsequently changed from losartan to olmesartan. The patient also reports a history of reflux, managed with daily pantoprazole. Despite these changes, the cough persists, often triggered by a scratchy throat sensation, and is sometimes accompanied by a sensation of mucus drainage. The cough is described as dry, with no significant sputum production. The patient has tried various interventions, including changing bedding and using cough drops, which provide temporary relief. The cough has been disruptive, causing discomfort and soreness, and was particularly noticeable during a recent two-week period of jury duty. The patient denies any history of asthma, medication allergies, food allergies, or eczema. The patient's immunizations are up to date.  He has not seen any other specialist for her ongoing cough.  He is not on any allergy medications at this time.  Allergy and Asthma office visit 03/22/23 Intradermal allergy testing was negative  Chronic Cough-suspected upper airway cough syndrome Persistent cough since February, possibly related to antihypertensive medication (initially losartan, now olmesartan). No clear triggers identified. Cough is dry, with occasional sensation of mucus in the throat. No asthma history. Lungs sound clear on examination. -Spirometry normal on initial visit. -allergy testing today negative on skin and intradermal testing -Recommend primary care physician consider adjusting antihypertensive medication (consider a non-ARB or ACE-I). -Continue saline spray and Flonase 1 spray each nostril twice daily to manage potential nasal drainage. Can be purchased OTC> - Refer to ENT to look for other  potential airway causes   Hypertension Currently managed with  olmesartan. History of medication changes due to possible side effects (cough). -Communicate with primary care physician about potential medication adjustment due to persistent cough.   Gastroesophageal Reflux Disease (GERD) Managed with pantoprazole 40mg  daily. No current symptoms of heartburn or chest pain. -Continue pantoprazole 40 mg daily as prescribed.    Past Medical History:  Diagnosis Date   Anxiety    BPH (benign prostatic hyperplasia)    BPV (benign positional vertigo)    CAD (coronary artery disease), native coronary artery    Coronary CTA showed a coronary calcium score of 613 which is 75th percentile for age, gender and race. <25% proximal and distal RCA, 25 to 49% mid RCA, 25 to 49% ostial OM1, <25% PDA and LCx, <25% LM and ostial LAD, 25-49% mid LAD/D1/D2  by coronary CTA 2/24   Diverticulosis    Duodenal ulcer    First degree heart block    GERD (gastroesophageal reflux disease)    Glaucoma    History of adenomatous polyp of colon    Hypercholesteremia    Osteoarthritis    Other chronic pain    Sinus bradycardia    Thrombocytopenia (HCC)     Past Surgical History:  Procedure Laterality Date   APPENDECTOMY     APPENDECTOMY  2010   BACK SURGERY     LOWER BACK SURGERY  1997   MASS EXCISION  12/11/2011   Procedure: MINOR EXCISION OF MASS;  Surgeon: Wyn Forster., MD;  Location: [facility];  Service: Orthopedics;  Laterality: Right;   Excision Mucoid Cyst Right Thumb; Interphalangeal Joint Debridement    Family History  Problem Relation Age of Onset   Diabetes Mother    Cancer Sister     Social History:  reports that he has never smoked. He has never been exposed to tobacco smoke. He does not have any smokeless tobacco history on file. He reports that he does not currently use alcohol. He reports that he does not use drugs.  Allergies:  Allergies  Allergen Reactions   Losartan Potassium Cough    Medications: I have reviewed the  patient's current medications.  The PMH, PSH, Medications, Allergies, and SH were reviewed and updated.  ROS: Constitutional: Negative for fever, weight loss and weight gain. Cardiovascular: Negative for chest pain and dyspnea on exertion. Respiratory: Is not experiencing shortness of breath at rest. Gastrointestinal: Negative for nausea and vomiting. Neurological: Negative for headaches. Psychiatric: The patient is not nervous/anxious  Blood pressure (!) 151/81, pulse (!) 55, height 5\' 11"  (1.803 m), weight 203 lb (92.1 kg), SpO2 94%.  PHYSICAL EXAM:  Exam: General: Well-developed, well-nourished Respiratory Respiratory effort: Equal inspiration and expiration without stridor Cardiovascular Peripheral Vascular: Warm extremities with equal color/perfusion Eyes: No nystagmus with equal extraocular motion bilaterally Neuro/Psych/Balance: Patient oriented to person, place, and time; Appropriate mood and affect; Gait is intact with no imbalance; Cranial nerves I-XII are intact Head and Face Inspection: Normocephalic and atraumatic without mass or lesion Palpation: Facial skeleton intact without bony stepoffs Salivary Glands: No mass or tenderness Facial Strength: Facial motility symmetric and full bilaterally ENT Pinna: External ear intact and fully developed External canal: Canal is patent with intact skin Tympanic Membrane: Clear and mobile External Nose: No scar or anatomic deformity Internal Nose: Septum is deviated to the left. No polyp, or purulence. Mucosal edema and erythema present.  Bilateral inferior turbinate hypertrophy.  Lips, Teeth, and gums: Mucosa and teeth intact  and viable TMJ: No pain to palpation with full mobility Oral cavity/oropharynx: No erythema or exudate, no lesions present Nasopharynx: No mass or lesion with intact mucosa Hypopharynx: Intact mucosa without pooling of secretions Larynx Glottic: Full true vocal cord mobility without lesion or  mass Supraglottic: Normal appearing epiglottis and AE folds Interarytenoid Space: Moderate pachydermia&edema Subglottic Space: Patent without lesion or edema Neck Neck and Trachea: Midline trachea without mass or lesion Thyroid: No mass or nodularity Lymphatics: No lymphadenopathy  Procedure: Preoperative diagnosis: chronic cough   Postoperative diagnosis:   Same + GERD LPR  Procedure: Flexible fiberoptic laryngoscopy  Surgeon: Dustin Croon, MD  Anesthesia: Topical lidocaine and Afrin Complications: None Condition is stable throughout exam  Indications and consent:  The patient presents to the clinic with Indirect laryngoscopy view was incomplete. Thus it was recommended that they undergo a flexible fiberoptic laryngoscopy. All of the risks, benefits, and potential complications were reviewed with the patient preoperatively and verbal informed consent was obtained.  Procedure: The patient was seated upright in the clinic. Topical lidocaine and Afrin were applied to the nasal cavity. After adequate anesthesia had occurred, I then proceeded to pass the flexible telescope into the nasal cavity. The nasal cavity was patent without rhinorrhea or polyp. The nasopharynx was also patent without mass or lesion. The base of tongue was visualized and was normal. There were no signs of pooling of secretions in the piriform sinuses. The true vocal folds were mobile bilaterally. There were no signs of glottic or supraglottic mucosal lesion or mass. There was moderate interarytenoid pachydermia and post cricoid edema. The telescope was then slowly withdrawn and the patient tolerated the procedure throughout.    PROCEDURE NOTE: nasal endoscopy  Preoperative diagnosis: chronic nasal congestion and post-nasal drip, chronic cough   Postoperative diagnosis: same  Procedure: Diagnostic nasal endoscopy (78295)  Surgeon: Dustin Berger, M.D.  Anesthesia: Topical lidocaine and Afrin  H&P REVIEW:  The patient's history and physical were reviewed today prior to procedure. All medications were reviewed and updated as well. Complications: None Condition is stable throughout exam Indications and consent: The patient presents with symptoms of chronic sinusitis not responding to previous therapies. All the risks, benefits, and potential complications were reviewed with the patient preoperatively and informed consent was obtained. The time out was completed with confirmation of the correct procedure.   Procedure: The patient was seated upright in the clinic. Topical lidocaine and Afrin were applied to the nasal cavity. After adequate anesthesia had occurred, the rigid nasal endoscope was passed into the nasal cavity. The nasal mucosa, turbinates, septum, and sinus drainage pathways were visualized bilaterally. This revealed no purulence or significant secretions that might be cultured. There were no polyps or sites of significant inflammation. The mucosa was intact and there was no crusting present. The scope was then slowly withdrawn and the patient tolerated the procedure well. There were no complications or blood loss.    Studies Reviewed: CXR 07/30/2012 IMPRESSION:   1. Mild cardiomegaly, without edema.  2.  Thoracic spondylosis.  3.  Pleural thickening likely mild peripheral scarring along the  lingula.  This is a chronic appearance, unchanged from 2010. 4  Intradermal allergy test negative 03/22/23 normal spirometry 03/10/23  Assessment/Plan: Encounter Diagnoses  Name Primary?   Chronic cough Yes   Chronic GERD    Chronic nasal congestion    Post-nasal drip    Non-allergic vasomotor rhinitis     Assessment and Plan    Chronic Cough Intermittent, daily non-productive cough  since April 2024 with an itchy throat sensation. No voice changes, dysphagia, odynophagia or dyspnea. No smoking or lung disease history. Possible etiologies include medication side effects, reflux, post-nasal  drainage or neurogenic cough. Physical exam including flexible laryngoscopy/bilateral nasal endoscopy: no tumors or lesions, mild nasal congestion, and post-cricoid edema/pachydermia indicative of reflux changes. Will obtain a Chest x-ray to rule out lung pathology (last on was in 2014). Chronic reflux can cause cough without heartburn; optimizing reflux control might help. Neurogenic cough treatments if other interventions fail. - Order chest x-ray - Prescribe nighttime reflux medication - Pepcid 20 mg QHS - Continue Protonix 40 mg in the morning - Recommend lifestyle and diet changes for reflux - Recommend seaweed-based paste Reflux Gourmet after meals - Continue Flonase 2 puffs b/l nares BID - Prescribe Tessalon 100 mg Q12hrs PRN for cough to be taken as needed  - will consider SLN block if workup is unrevealing and he continues to cough    GERD LPR - Prescribe nighttime reflux medication - Pepcid 20 mg QHS - Continue Protonix 40 mg in the morning - Recommend lifestyle and diet changes for reflux - Recommend seaweed-based paste Reflux Gourmet after meals - will consider esophagram in the future to rule out hiatal hernia  Post-nasal drainage  - continue Flonase 2 puffs b/l nares BID   Follow-up - Schedule chest x-ray - Follow up in a few weeks.        Thank you for allowing me to participate in the care of this patient. Please do not hesitate to contact me with any questions or concerns.   Dustin Croon, MD Otolaryngology [facility] Phone: [phone] Fax: [phone]    04/15/2023, 11:43 AM

## 2023-04-15 NOTE — Patient Instructions (Addendum)
[email or web address] - check out this website to learn more about reflux   -Avoid lying down for at least two hours after a meal or after drinking acidic beverages, like soda, or other caffeinated beverages. This can help to prevent stomach contents from flowing back into the esophagus. -Keep your head elevated while you sleep. Using an extra pillow or two can also help to prevent reflux. -Eat smaller and more frequent meals each day instead of a few large meals. This promotes digestion and can aid in preventing heartburn. -Wear loose-fitting clothes to ease pressure on the stomach, which can worsen heartburn and reflux. -Reduce excess weight around the midsection. This can ease pressure on the stomach. Such pressure can force some stomach contents back up the esophagus  - Take Reflux Gourmet (natural supplement available on [employer]) to help with symptoms of chronic throat irritation     Continue Flonase  Get Chest X-ray Return in 3 months

## 2023-07-06 DIAGNOSIS — I2510 Atherosclerotic heart disease of native coronary artery without angina pectoris: Secondary | ICD-10-CM | POA: Diagnosis not present

## 2023-07-06 DIAGNOSIS — Z8719 Personal history of other diseases of the digestive system: Secondary | ICD-10-CM | POA: Diagnosis not present

## 2023-07-06 DIAGNOSIS — F411 Generalized anxiety disorder: Secondary | ICD-10-CM | POA: Diagnosis not present

## 2023-07-06 DIAGNOSIS — D696 Thrombocytopenia, unspecified: Secondary | ICD-10-CM | POA: Diagnosis not present

## 2023-07-06 DIAGNOSIS — R058 Other specified cough: Secondary | ICD-10-CM | POA: Diagnosis not present

## 2023-07-06 DIAGNOSIS — I10 Essential (primary) hypertension: Secondary | ICD-10-CM | POA: Diagnosis not present

## 2023-07-15 ENCOUNTER — Ambulatory Visit (INDEPENDENT_AMBULATORY_CARE_PROVIDER_SITE_OTHER): Payer: Medicare HMO | Admitting: Otolaryngology

## 2023-10-09 ENCOUNTER — Other Ambulatory Visit (INDEPENDENT_AMBULATORY_CARE_PROVIDER_SITE_OTHER): Payer: Self-pay | Admitting: Otolaryngology

## 2023-11-08 ENCOUNTER — Other Ambulatory Visit (INDEPENDENT_AMBULATORY_CARE_PROVIDER_SITE_OTHER): Payer: Self-pay | Admitting: Otolaryngology

## 2023-12-07 ENCOUNTER — Other Ambulatory Visit (INDEPENDENT_AMBULATORY_CARE_PROVIDER_SITE_OTHER): Payer: Self-pay | Admitting: Otolaryngology

## 2023-12-20 DIAGNOSIS — H2513 Age-related nuclear cataract, bilateral: Secondary | ICD-10-CM | POA: Diagnosis not present

## 2023-12-20 DIAGNOSIS — H04123 Dry eye syndrome of bilateral lacrimal glands: Secondary | ICD-10-CM | POA: Diagnosis not present

## 2024-01-07 DIAGNOSIS — E7800 Pure hypercholesterolemia, unspecified: Secondary | ICD-10-CM | POA: Diagnosis not present

## 2024-01-07 DIAGNOSIS — Z1331 Encounter for screening for depression: Secondary | ICD-10-CM | POA: Diagnosis not present

## 2024-01-07 DIAGNOSIS — Z23 Encounter for immunization: Secondary | ICD-10-CM | POA: Diagnosis not present

## 2024-01-07 DIAGNOSIS — Z125 Encounter for screening for malignant neoplasm of prostate: Secondary | ICD-10-CM | POA: Diagnosis not present

## 2024-01-07 DIAGNOSIS — Z0000 Encounter for general adult medical examination without abnormal findings: Secondary | ICD-10-CM | POA: Diagnosis not present

## 2024-01-07 DIAGNOSIS — I10 Essential (primary) hypertension: Secondary | ICD-10-CM | POA: Diagnosis not present

## 2024-01-08 ENCOUNTER — Other Ambulatory Visit (INDEPENDENT_AMBULATORY_CARE_PROVIDER_SITE_OTHER): Payer: Self-pay | Admitting: Otolaryngology

## 2024-02-08 ENCOUNTER — Other Ambulatory Visit (INDEPENDENT_AMBULATORY_CARE_PROVIDER_SITE_OTHER): Payer: Self-pay | Admitting: Otolaryngology

## 2024-02-22 IMAGING — DX DG LUMBAR SPINE 2-3V
3 series · 3 of 3 positions shown · non-contrast
Comparison: X-ray 03/24/2013.

CLINICAL DATA: Chronic low back pain.

EXAM:
LUMBAR SPINE - 2-3 VIEW

[dg lumbar spine 2-3 views (1 of 3)]
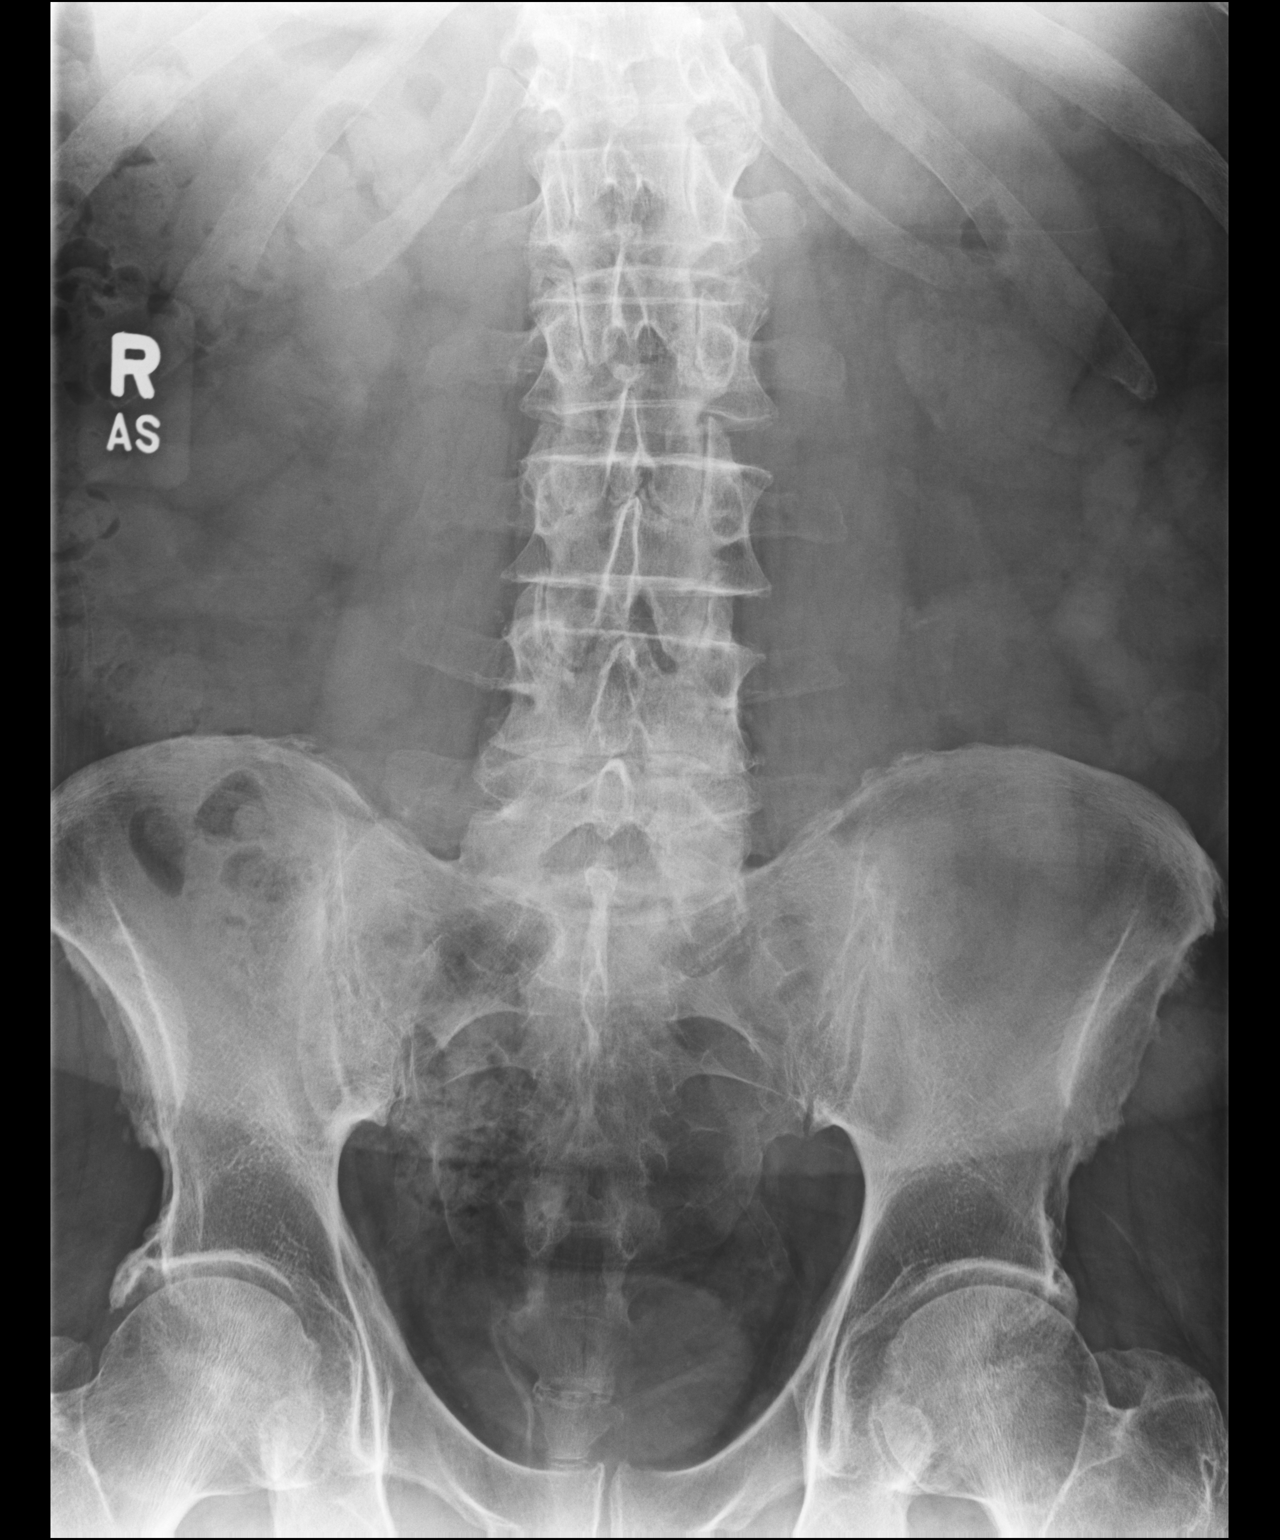

[dg lumbar spine 2-3 views (2 of 3)]
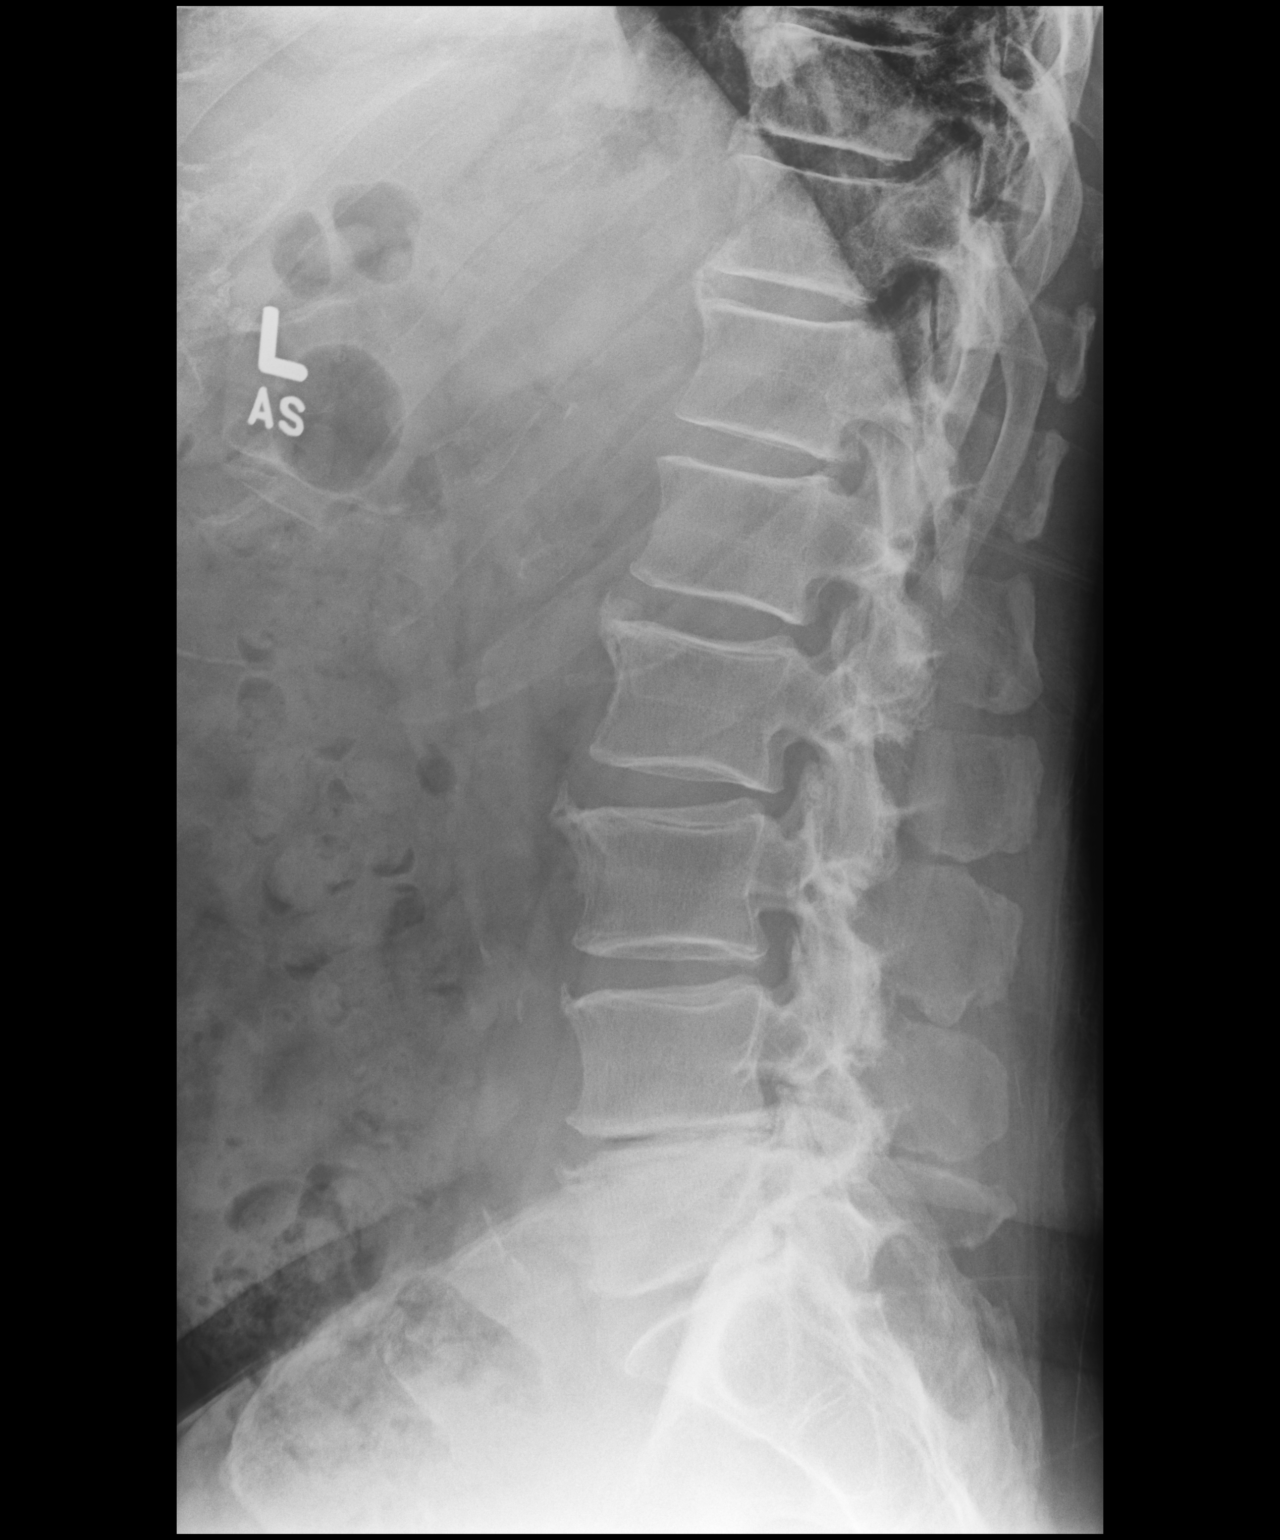

[dg lumbar spine 2-3 views (3 of 3)]
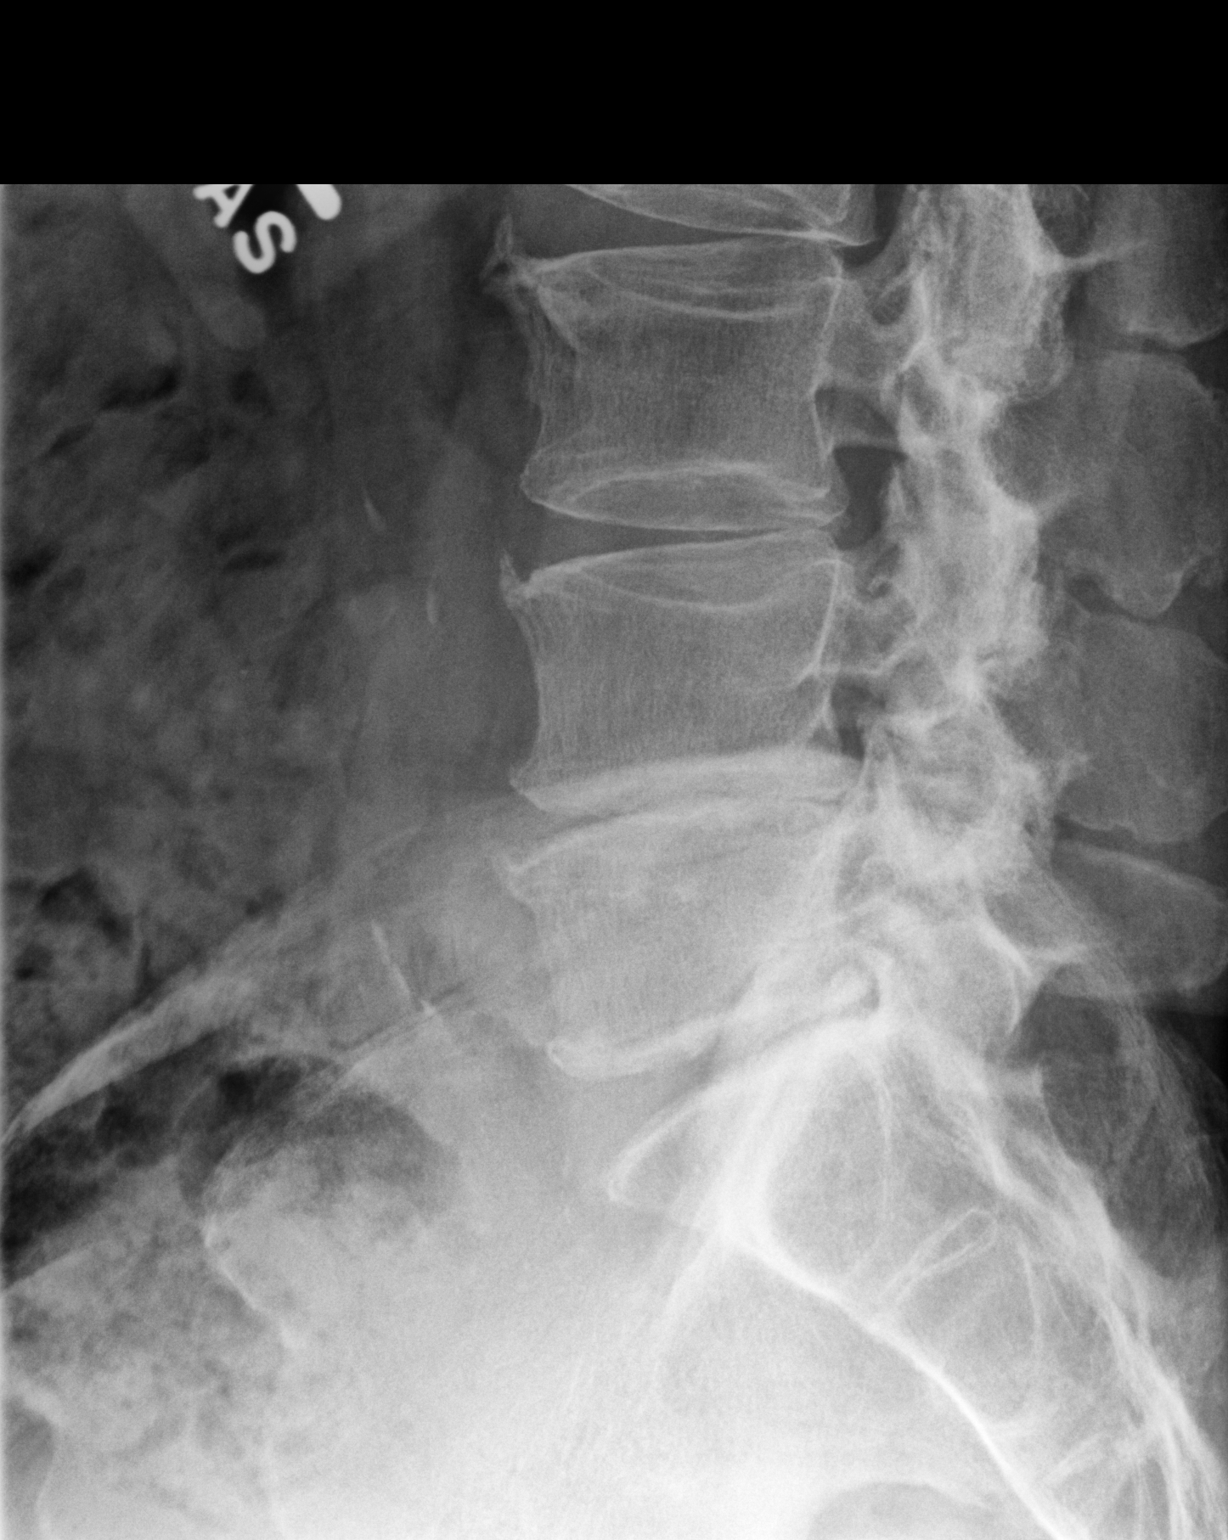

[3 of 3 positions shown; findings below may reference images not displayed]

FINDINGS: Frontal and lateral views of the lumbar spine demonstrate 5
non-rib-bearing lumbar type vertebral bodies in grossly normal
alignment. No acute fractures. There is progressive spondylosis
within the lower lumbar spine greatest at L4-5. Extensive multilevel
facet hypertrophic changes are seen, more pronounced from L3-4
through L5-S1. Sacroiliac joints are grossly unremarkable.
Visualized portions of the pelvis are normal.
IMPRESSION: 1. Progressive lower lumbar spondylosis and facet hypertrophy. No
acute bony abnormality.

## 2024-03-07 ENCOUNTER — Other Ambulatory Visit (INDEPENDENT_AMBULATORY_CARE_PROVIDER_SITE_OTHER): Payer: Self-pay | Admitting: Otolaryngology

## 2024-03-29 ENCOUNTER — Other Ambulatory Visit: Payer: Self-pay

## 2024-03-29 ENCOUNTER — Emergency Department (HOSPITAL_BASED_OUTPATIENT_CLINIC_OR_DEPARTMENT_OTHER)
Admission: EM | Admit: 2024-03-29 | Discharge: 2024-03-29 | Disposition: A | Discharge status: Home / Self Care | Attending: Emergency Medicine | Admitting: Emergency Medicine

## 2024-03-29 DIAGNOSIS — I10 Essential (primary) hypertension: Secondary | ICD-10-CM | POA: Insufficient documentation

## 2024-03-29 DIAGNOSIS — R030 Elevated blood-pressure reading, without diagnosis of hypertension: Secondary | ICD-10-CM | POA: Diagnosis not present

## 2024-03-29 DIAGNOSIS — Z7982 Long term (current) use of aspirin: Secondary | ICD-10-CM | POA: Insufficient documentation

## 2024-03-29 NOTE — ED Triage Notes (Signed)
 Pt POV reporting HTN today after yard work, 160s sys at home, reports feeling flushed, denies blurred vision or headache, taking olmesartan as prescribed.

## 2024-03-29 NOTE — Discharge Instructions (Addendum)
 As we discussed, your blood pressure has been normal since coming to the ED tonight. And you have not had symptoms concerning for sustained high blood pressure such as chest pain. You can be safely discharged home tonight and should follow up with your doctor in one week for recheck.   Please continue taking your blood pressure twice a day, at the same time everyday. Use the form provided to keep a log of the measurements and take this with you to your doctor's appointment for review.   If you develop chest pain, significant shortness of breath, significant headache or persistent dizziness, return to the ED for further evaluation.

## 2024-03-29 NOTE — ED Provider Notes (Signed)
 [place] EMERGENCY DEPARTMENT AT [facility] Provider Note   CSN: [id number] Arrival date & time: 03/29/24  8057     Patient presents with: No chief complaint on file.   Dustin Berger is a 73 y.o. male.   Patient to ED for evaluation of blood pressure that has been elevated for the past 3 days. He reports he is normally very well controlled. He is compliant with his medications and has not required any recent changes. No chest pain, SOB, nausea, headaches or peripheral swelling. He first noticed readings of 180 systolic at a dental appointment 2 days ago and has had consistently similar readings on his machine at home, including earlier today.   The history is provided by the patient. No language interpreter was used.       Prior to Admission medications   Medication Sig Start Date End Date Taking? Authorizing Provider  acetaminophen  (TYLENOL ) 325 MG tablet Take 650 mg by mouth every 6 (six) hours as needed.    [provider]  aspirin  EC 81 MG tablet Take 1 tablet (81 mg total) by mouth daily. Swallow whole. 06/25/22   Shlomo Wilbert SAUNDERS, MD  benzonatate  (TESSALON ) 100 MG capsule Take 1 capsule (100 mg total) by mouth 2 (two) times daily as needed for cough. 04/15/23   Soldatova, Liuba, MD  clonazePAM (KLONOPIN) 0.5 MG tablet Take 0.5 mg by mouth 2 (two) times daily as needed for anxiety.    [provider]  escitalopram (LEXAPRO) 10 MG tablet 1 tablet Orally Once a day for 30 days 03/25/23   [provider]  famotidine  (PEPCID ) 20 MG tablet TAKE 1 TABLET BY MOUTH AT BEDTIME 03/07/24   Hedges, Reyes, PA-C  meclizine (ANTIVERT) 25 MG tablet Take 25 mg by mouth 3 (three) times daily as needed for dizziness.    [provider]  olmesartan (BENICAR) 20 MG tablet Take 20 mg by mouth daily.    [provider]  pantoprazole (PROTONIX) 40 MG tablet Take 40 mg by mouth daily.    [provider]  rosuvastatin (CRESTOR) 20 MG  tablet Take 20 mg by mouth daily. 06/25/22   Husain, Karrar, MD  tadalafil (CIALIS) 20 MG tablet 1 tablet as needed Orally Every 3 days as needed for 30 days 01/05/23   [provider]    Allergies: Losartan potassium    Review of Systems  Updated Vital Signs BP 126/70   Pulse 73   Temp 98.3 F (36.8 C) (Oral)   Resp 17   Ht 5' 11 (1.803 m)   Wt 90.7 kg   SpO2 95%   BMI 27.89 kg/m   Physical Exam Vitals and nursing note reviewed.  Constitutional:      Appearance: He is well-developed.  HENT:     Head: Normocephalic.  Neck:     Vascular: No carotid bruit.  Cardiovascular:     Rate and Rhythm: Normal rate and regular rhythm.     Heart sounds: No murmur heard. Pulmonary:     Effort: Pulmonary effort is normal.     Breath sounds: Normal breath sounds. No wheezing, rhonchi or rales.  Abdominal:     Palpations: Abdomen is soft.  Musculoskeletal:        General: Normal range of motion.     Cervical back: Normal range of motion and neck supple.     Right lower leg: No edema.     Left lower leg: No edema.  Skin:    General:  Skin is warm and dry.  Neurological:     General: No focal deficit present.     Mental Status: He is alert and oriented to person, place, and time.     (all labs ordered are listed, but only abnormal results are displayed) Labs Reviewed - No data to display  EKG: None  Radiology: No results found.   Procedures   Medications Ordered in the ED - No data to display  Clinical Course as of 03/29/24 2134  Wed Mar 29, 2024  2131 Patient to ED with concern for 3 days of asymptomatic elevated blood pressure readings stating 180's systolic. Multiple blood pressure readings in the ED are normal. As he is asymptomatic, will encourage keeping a log of twice a day readings at home and making a recheck appointment in one week with his doctor to review. Patient has no further concerns and is comfortable with discharge home.  [SU]    Clinical Course  User Index [SU] Odell Balls, PA-C                                 Medical Decision Making       Final diagnoses:  Hypertension, unspecified type    ED Discharge Orders     None          Odell Balls, PA-C 03/29/24 2134    Pamella Ozell LABOR, DO 04/05/24 [phone]

## 2024-04-02 ENCOUNTER — Other Ambulatory Visit (INDEPENDENT_AMBULATORY_CARE_PROVIDER_SITE_OTHER): Payer: Self-pay | Admitting: Physician Assistant

## 2024-05-06 ENCOUNTER — Other Ambulatory Visit (INDEPENDENT_AMBULATORY_CARE_PROVIDER_SITE_OTHER): Payer: Self-pay | Admitting: Physician Assistant

## 2024-05-26 ENCOUNTER — Telehealth (INDEPENDENT_AMBULATORY_CARE_PROVIDER_SITE_OTHER): Payer: Self-pay

## 2024-05-26 NOTE — Telephone Encounter (Signed)
 Called and spoke to patient about refill request for Famotidine . I explained to him that Dr. Soldatova is no longer with us  and that he would need to be seen by another provider for us  to refill his medication. Patient said that he would like to see if his PCP can write the prescription for him. I let him know that he could give us  a call back if he needs anything from us . Patient understood.
